# Patient Record
Sex: Female | Born: 1990 | Race: Black or African American | Hispanic: No | Marital: Married | Smoking: Never smoker
Health system: Southern US, Community
[De-identification: ages and names within clinical notes are randomized; demographics above are authoritative.]

## PROBLEM LIST (undated history)

## (undated) DIAGNOSIS — G43909 Migraine, unspecified, not intractable, without status migrainosus: Secondary | ICD-10-CM

## (undated) DIAGNOSIS — J45909 Unspecified asthma, uncomplicated: Secondary | ICD-10-CM

## (undated) DIAGNOSIS — I10 Essential (primary) hypertension: Secondary | ICD-10-CM

---

## 2015-12-03 ENCOUNTER — Emergency Department (HOSPITAL_BASED_OUTPATIENT_CLINIC_OR_DEPARTMENT_OTHER)
Admission: EM | Admit: 2015-12-03 | Discharge: 2015-12-03 | Disposition: A | Discharge status: Home / Self Care | Payer: Medicaid Other | Attending: Emergency Medicine | Admitting: Emergency Medicine

## 2015-12-03 ENCOUNTER — Emergency Department (HOSPITAL_BASED_OUTPATIENT_CLINIC_OR_DEPARTMENT_OTHER): Payer: Medicaid Other

## 2015-12-03 ENCOUNTER — Encounter (HOSPITAL_BASED_OUTPATIENT_CLINIC_OR_DEPARTMENT_OTHER): Payer: Self-pay | Admitting: Emergency Medicine

## 2015-12-03 DIAGNOSIS — O99611 Diseases of the digestive system complicating pregnancy, first trimester: Secondary | ICD-10-CM | POA: Diagnosis not present

## 2015-12-03 DIAGNOSIS — J45901 Unspecified asthma with (acute) exacerbation: Secondary | ICD-10-CM | POA: Insufficient documentation

## 2015-12-03 DIAGNOSIS — Z3A01 Less than 8 weeks gestation of pregnancy: Secondary | ICD-10-CM | POA: Insufficient documentation

## 2015-12-03 DIAGNOSIS — Z3490 Encounter for supervision of normal pregnancy, unspecified, unspecified trimester: Secondary | ICD-10-CM

## 2015-12-03 DIAGNOSIS — K0889 Other specified disorders of teeth and supporting structures: Secondary | ICD-10-CM | POA: Insufficient documentation

## 2015-12-03 DIAGNOSIS — O99511 Diseases of the respiratory system complicating pregnancy, first trimester: Secondary | ICD-10-CM | POA: Diagnosis not present

## 2015-12-03 HISTORY — DX: Unspecified asthma, uncomplicated: J45.909

## 2015-12-03 LAB — PREGNANCY, URINE: Preg Test, Ur: POSITIVE — AB

## 2015-12-03 MED ORDER — LORAZEPAM 1 MG PO TABS
0.5000 mg | ORAL_TABLET | Freq: Once | ORAL | Status: DC
  Filled 2015-12-03: qty 1

## 2015-12-03 MED ORDER — ACETAMINOPHEN 325 MG PO TABS
650.0000 mg | ORAL_TABLET | Freq: Once | ORAL | Status: AC
  Administered 2015-12-03: 650 mg via ORAL
  Filled 2015-12-03: qty 2

## 2015-12-03 MED ORDER — ACETAMINOPHEN 325 MG PO TABS
ORAL_TABLET | ORAL | Status: AC
  Filled 2015-12-03: qty 1

## 2015-12-03 MED ORDER — OXYCODONE-ACETAMINOPHEN 5-325 MG PO TABS
1.0000 | ORAL_TABLET | Freq: Once | ORAL | Status: DC
  Filled 2015-12-03: qty 1

## 2015-12-03 MED ORDER — ALBUTEROL SULFATE HFA 108 (90 BASE) MCG/ACT IN AERS
2.0000 | INHALATION_SPRAY | Freq: Once | RESPIRATORY_TRACT | Status: AC
  Administered 2015-12-03: 2 via RESPIRATORY_TRACT
  Filled 2015-12-03: qty 6.7

## 2015-12-03 MED ORDER — IBUPROFEN 400 MG PO TABS
400.0000 mg | ORAL_TABLET | Freq: Once | ORAL | Status: DC
  Filled 2015-12-03: qty 1

## 2015-12-03 NOTE — ED Notes (Signed)
Patient states she has mouth pain from gums swelling on the lower right back side.  Patient states she has become short of breath since yesterday and while she was sleeping.  She states she has asthma, but the SOB seems different.  She denies fever, vomiting or fatigue.

## 2015-12-03 NOTE — ED Notes (Signed)
Patient requesting a pregnancy test before receiving medications.

## 2015-12-03 NOTE — ED Notes (Signed)
Patient stable and ambulatory. Patient verbalizes understanding of discharge instructions and follow-up. 

## 2015-12-03 NOTE — ED Notes (Signed)
Tylenol 325mg  was dropped on the floor

## 2015-12-03 NOTE — ED Provider Notes (Signed)
CSN: [id number]     Arrival date & time 12/03/15  0857 History   First MD Initiated Contact with Patient 12/03/15 [phone] [id number]     Chief Complaint  Patient presents with  . Dental Pain  . Shortness of Breath     (Consider location/radiation/quality/duration/timing/severity/associated sxs/prior Treatment) HPI   25 year old female with dental pain. Right lower molar. Gradual onset about 2-3 days ago. Constant and progressive since then. Denies any trauma. No fevers or chills. No neck pain or difficulty swallowing. No fevers or chills. She is additionally complaining of some dyspnea since yesterday. Has sensation that she cannot take a deep breath. She is a past history of asthma and says current symptoms feel different. Does not notice any change with exertion. No unusual leg pain or swelling. Has not tried taking anything for her symptoms. No recent surgeries, prolonged immobilization or exogenous hormone usage.  Past Medical History  Diagnosis Date  . Asthma    Past Surgical History  Procedure Laterality Date  . Cesarean section     No family history on file. Social History  Substance Use Topics  . Smoking status: Never Smoker   . Smokeless tobacco: Never Used  . Alcohol Use: No   OB History    Gravida Para Term Preterm AB TAB SAB Ectopic Multiple Living   2 0 0 0 1 0 0 0 0 1      Review of Systems  All systems reviewed and negative, other than as noted in HPI.   Allergies  Review of patient's allergies indicates not on file.  Home Medications   Prior to Admission medications   Not on File   BP 138/73 mmHg  Pulse 86  Temp(Src) 98.4 F (36.9 C) (Oral)  Resp 18  Ht 5\' 7"  (1.702 m)  Wt 179 lb 6.4 oz (81.375 kg)  BMI 28.09 kg/m2  SpO2 100% Physical Exam  Constitutional: She appears well-developed and well-nourished. No distress.  HENT:  Head: Normocephalic and atraumatic.  Right lower third molar erupting. May be somewhat impacted. Some mild surrounding gingival  swelling. No obvious abscess. Submental tissues are soft. Neck is supple. Handling secretions. Normal sounding voice. No adenopathy.  Eyes: Conjunctivae are normal. Right eye exhibits no discharge. Left eye exhibits no discharge.  Neck: Neck supple.  Cardiovascular: Normal rate, regular rhythm and normal heart sounds.  Exam reveals no gallop and no friction rub.   No murmur heard. Pulmonary/Chest: Effort normal and breath sounds normal. No respiratory distress.  Abdominal: Soft. She exhibits no distension. There is no tenderness.  Musculoskeletal: She exhibits no edema or tenderness.  Lower extremities symmetric as compared to each other. No calf tenderness. Negative Homan's. No palpable cords.   Neurological: She is alert.  Skin: Skin is warm and dry.  Psychiatric: She has a normal mood and affect. Her behavior is normal. Thought content normal.  Nursing note and vitals reviewed.   ED Course  Procedures (including critical care time) Labs Review Labs Reviewed  PREGNANCY, URINE - Abnormal; Notable for the following:    Preg Test, Ur POSITIVE (*)    All other components within normal limits    Imaging Review No results found. I have personally reviewed and evaluated these images and lab results as part of my medical decision-making.   EKG Interpretation   Date/Time:  Friday December 03 2015 09:33:11 EST Ventricular Rate:  81 PR Interval:  184 QRS Duration: 99 QT Interval:  358 QTC Calculation: 415 R Axis:   -11 Text Interpretation:  Sinus rhythm RSR' in V1 or V2, right VCD or RVH No  old tracing to compare Confirmed by Juleen China  MD, Meleny Tregoning [phone]) on 12/03/2015  9:40:27 AM      MDM   Final diagnoses:  Pain, dental  Pregnancy    25 year old female with dental pain. Some mild gingival swelling around her right lower third molar. Her wisdom tooth may be somewhat impacted. No drainable collection. HEENT exam is otherwise unremarkable. She is also complaining some chest  tightness/dyspnea. She is in no acute distress on exam though. She is afebrile and hemodynamically stable. No increased work of breathing. Oxygen saturations are normal on room air. She does report a past history of asthma, although I cannot appreciate any wheezing on my exam. Will try some albuterol to see if she gets symptomatic relief from this. Basic screening studies with a chest x-ray and EKG. Overall I have a low suspicion for emergent process though. Doubt PE, dissection, ACS or other emergent pathology.    Raeford Razor, MD 12/05/15 1230

## 2015-12-03 NOTE — ED Notes (Signed)
MD notified the pregnancy test was positive.  Orders cancelled per MD's request.  No medication given to patient.

## 2015-12-03 NOTE — ED Notes (Signed)
Patient states she has been to the doctor and she is 51/[redacted] weeks pregnant and she looked on her phone and her last cycle was 10/27/2015.  OB/GYN dates were corrected.

## 2015-12-03 NOTE — Discharge Instructions (Signed)
First Trimester of Pregnancy The first trimester of pregnancy is from week 1 until the end of week 12 (months 1 through 3). During this time, your baby will begin to develop inside you. At 6-8 weeks, the eyes and face are formed, and the heartbeat can be seen on ultrasound. At the end of 12 weeks, all the baby's organs are formed. Prenatal care is all the medical care you receive before the birth of your baby. Make sure you get good prenatal care and follow all of your doctor's instructions. HOME CARE  Medicines  Take medicine only as told by your doctor. Some medicines are safe and some are not during pregnancy.  Take your prenatal vitamins as told by your doctor.  Take medicine that helps you poop (stool softener) as needed if your doctor says it is okay. Diet  Eat regular, healthy meals.  Your doctor will tell you the amount of weight gain that is right for you.  Avoid raw meat and uncooked cheese.  If you feel sick to your stomach (nauseous) or throw up (vomit):  Eat 4 or 5 small meals a day instead of 3 large meals.  Try eating a few soda crackers.  Drink liquids between meals instead of during meals.  If you have a hard time pooping (constipation):  Eat high-fiber foods like fresh vegetables, fruit, and whole grains.  Drink enough fluids to keep your pee (urine) clear or pale yellow. Activity and Exercise  Exercise only as told by your doctor. Stop exercising if you have cramps or pain in your lower belly (abdomen) or low back.  Try to avoid standing for long periods of time. Move your legs often if you must stand in one place for a long time.  Avoid heavy lifting.  Wear low-heeled shoes. Sit and stand up straight.  You can have sex unless your doctor tells you not to. Relief of Pain or Discomfort  Wear a good support bra if your breasts are sore.  Take warm water baths (sitz baths) to soothe pain or discomfort caused by hemorrhoids. Use hemorrhoid cream if your  doctor says it is okay.  Rest with your legs raised if you have leg cramps or low back pain.  Wear support hose if you have puffy, bulging veins (varicose veins) in your legs. Raise (elevate) your feet for 15 minutes, 3-4 times a day. Limit salt in your diet. Prenatal Care  Schedule your prenatal visits by the twelfth week of pregnancy.  Write down your questions. Take them to your prenatal visits.  Keep all your prenatal visits as told by your doctor. Safety  Wear your seat belt at all times when driving.  Make a list of emergency phone numbers. The list should include numbers for family, friends, the hospital, and police and fire departments. General Tips  Ask your doctor for a referral to a local prenatal class. Begin classes no later than at the start of month 6 of your pregnancy.  Ask for help if you need counseling or help with nutrition. Your doctor can give you advice or tell you where to go for help.  Do not use hot tubs, steam rooms, or saunas.  Do not douche or use tampons or scented sanitary pads.  Do not cross your legs for long periods of time.  Avoid litter boxes and soil used by cats.  Avoid all smoking, herbs, and alcohol. Avoid drugs not approved by your doctor.  Do not use any tobacco products, including cigarettes,  chewing tobacco, and electronic cigarettes. If you need help quitting, ask your doctor. You may get counseling or other support to help you quit.  Visit your dentist. At home, brush your teeth with a soft toothbrush. Be gentle when you floss. GET HELP IF:  You are dizzy.  You have mild cramps or pressure in your lower belly.  You have a nagging pain in your belly area.  You continue to feel sick to your stomach, throw up, or have watery poop (diarrhea).  You have a bad smelling fluid coming from your vagina.  You have pain with peeing (urination).  You have increased puffiness (swelling) in your face, hands, legs, or ankles. GET HELP  RIGHT AWAY IF:   You have a fever.  You are leaking fluid from your vagina.  You have spotting or bleeding from your vagina.  You have very bad belly cramping or pain.  You gain or lose weight rapidly.  You throw up blood. It may look like coffee grounds.  You are around people who have [place] measles, fifth disease, or chickenpox.  You have a very bad headache.  You have shortness of breath.  You have any kind of trauma, such as from a fall or a car accident.   This information is not intended to replace advice given to you by your health care provider. Make sure you discuss any questions you have with your health care provider.   Document Released: 04/24/2008 Document Revised: 11/27/2014 Document Reviewed: 09/16/2013 Elsevier Interactive Patient Education 2016 [employer]   Emergency Department Resource Guide 1) Find a Doctor and Pay Out of Pocket Although you won't have to find out who is covered by your insurance plan, it is a good idea to ask around and get recommendations. You will then need to call the office and see if the doctor you have chosen will accept you as a new patient and what types of options they offer for patients who are self-pay. Some doctors offer discounts or will set up payment plans for their patients who do not have insurance, but you will need to ask so you aren't surprised when you get to your appointment.  2) Contact Your Local Health Department Not all health departments have doctors that can see patients for sick visits, but many do, so it is worth a call to see if yours does. If you don't know where your local health department is, you can check in your phone book. The CDC also has a tool to help you locate your state's health department, and many state websites also have listings of all of their local health departments.  3) Find a Walk-in Clinic If your illness is not likely to be very severe or complicated, you may want to try a walk in  clinic. These are popping up all over the country in pharmacies, drugstores, and shopping centers. They're usually staffed by nurse practitioners or physician assistants that have been trained to treat common illnesses and complaints. They're usually fairly quick and inexpensive. However, if you have serious medical issues or chronic medical problems, these are probably not your best option.  No Primary Care Doctor: - Call Health Connect at  [phone] - they can help you locate a primary care doctor that  accepts your insurance, provides certain services, etc. - Physician Referral Service- [phone]  Chronic Pain Problems: Organization         Address  Phone   Notes  [facility]  [phone] Patients need to be referred by their primary care doctor.   Medication Assistance: Organization         Address  Phone   Notes  [facility] Medication [facility] [address] [place], [place] [address] [phone] --Must be a resident of [facility] -- Must have NO insurance coverage whatsoever (no Medicaid/ Medicare, etc.) -- The pt. MUST have a primary care doctor that directs their care regularly and follows them in the community   [facility]ist  [phone]   [employer]  [phone]    Agencies that provide inexpensive medical care: Organization         Address  Phone   Notes  [facility]  [phone]   [facility]    [phone]   [facility] [address] [place], [place] [address] [phone]   [facility] [address], [place] [phone]   Planned Parenthood    [phone]   [facility]    [phone]   [facility] and [facility]  [address], [place] Phone:  [phone], Fax:  [phone] Hours of Operation:  9 am - 6 pm, M-F.  Also accepts Medicaid/Medicare and self-pay.  [facility]  [address], [place] Phone: [phone], Fax: [phone]. Hours of Operation:  8:30 am - 5:30 pm, M-F.  Also accepts Medicaid and self-pay.  [facility] [place] [address], [place] Phone: [phone]   [facility] [address] Natasha Bence [place], [place] [phone], Ext. [phone] Mondays & Thursdays: 7-9 AM.  First 15 patients are seen on a first come, first serve basis.    Medicaid-accepting [facility] Providers:  Organization         Address  Phone   Notes  [facility] [address], [place] [phone] Also accepts self-pay patients.  [facility] [address] Laurell Josephs [place], [place]  [phone]   [facility] [address], [place] [phone]   [facility] [address] [place] [phone]   Renaye Rakers [address], [place]   [phone] Only accepts [place] Access [place] patients after they have their name applied to their card.   Self-Pay (no insurance) in [facility]:  Organization         Address  Phone   Notes  Sickle Cell Patients, [facility] [address] [place], [place] [phone]   [facility] [address] [place], [place] [phone]   [facility] [place]  [address], [place] [phone]   [facility]/Dr. Osei-Bonsu  [address], [place] or [address], [place] [phone] Phone number for both [place] and [place] locations is the same.  Urgent Medical and [facility] [address] [place] [phone]   [facility] [address] [place] or [address] [phone]   [facility] [address] [place] [phone], phone; [phone], fax Sees patients  1st and 3rd Saturday of every month.  Must not qualify for public or private insurance (i.e. Medicaid, Medicare, [place] Health Choice, Veterans' Benefits)  Household income should be no more than 200% of the poverty level The clinic cannot treat you if you are pregnant or think you are pregnant  Sexually transmitted diseases are not treated at the clinic.    Dental Care: Organization         Address  Phone  Notes  [facility] [address] [place], [place] [phone] Accepts children up to age 70 who are enrolled in [place] or [place] Health Choice; pregnant women with a Medicaid card; and children who have applied for Medicaid or [place] Health Choice, but were declined, whose parents can pay a reduced fee at time of service.  [facility] of [facility]  [address], [place] [phone] Accepts children up to age 52 who are enrolled in [place] or [place] Health Choice; pregnant women with a Medicaid card; and children who have applied for Medicaid or  Health Choice, but were declined, whose parents can pay a reduced fee at time of service.  [facility]  [address] [place], [place] [phone] Patients are seen by appointment only. Walk-ins are not accepted. [facility] will see patients 56 years of age and older. Monday - Tuesday (8am-5pm) Most Wednesdays (8:30-5pm) $30 per visit, cash only  [facility]  [address], [facility] [phone] Patients are seen by appointment only. Walk-ins are not accepted. [facility] will see patients 1 years of age and older. One Wednesday Evening (Monthly: Volunteer Based).  $30 per visit, cash only  [employer]  [phone] for adults; Children under age 83, call Graduate Pediatric Dentistry at [phone]. Children aged 32-14, please call [phone] to request a  pediatric application.  Dental services are provided in all areas of dental care including fillings, crowns and bridges, complete and partial dentures, implants, gum treatment, root canals, and extractions. Preventive care is also provided. Treatment is provided to both adults and children. Patients are selected via a lottery and there is often a waiting list.   [facility] [address], [place]  [phone] [email or web address]   [facility] [address] [place], [place] [phone], Ext. [phone] Second and Fourth Thursday of each month, opens at 6:30 AM; Clinic ends at 9 AM.  Patients are seen on a first-come first-served basis, and a limited number are seen during each clinic.   [facility]  [address] Ether Griffins [place], [place] [phone]   Eligibility Requirements You must have lived in [place], [place], or [place] counties for at least the last three months.   You cannot be eligible for state or federal sponsored [place], including [employer], [place], or [employer].   You generally cannot be eligible for healthcare insurance through your employer.    How to apply: Eligibility screenings are held every Tuesday and Wednesday afternoon from 1:00 pm until 4:00 pm. You do not need an appointment for the interview!  [facility] [address] [place], [place] [phone]   [facility] Health Department  [phone]   [facility] Health Department  [phone]   [facility] Health Department  [phone]    Behavioral Health Resources in the Community: Intensive Outpatient Programs Organization         Address  Phone  Notes  [facility] [address] [place], [place]  [phone]   [facility] [address] [place], [place] [phone]   [facility] [address] [place], [place]  [phone]   [facility]  Mental Health [address],  [place], [place] [phone] or [phone]   Substance Abuse Resources Organization         Address  Phone  Notes  [facility] Drug Services  [phone]   [facility]  [phone]   [facility] [place]  [phone]   Floydene Flock  [phone]   Residential & Outpatient Substance Abuse Program  [phone]   Psychological Services Organization         Address  Phone  Notes  [facility]  [phone]   [facility]  [phone]   [facility] [address], [place] [phone] or [phone]    Mobile Crisis Teams Organization         Address  Phone  Notes  [facility], Mobile Crisis Care Unit  [phone]   [facility]  [address] [place], [place] [phone]   Doristine Locks [address] [place] [phone]    Self-Help/Support Groups Organization         Address  Phone             Notes  [facility] of [place] - variety of support groups  336- I7437963 Call for more information  Narcotics Anonymous (NA), [facility] [address], [employer] [place]  2 meetings at this location   Statistician         Address  Phone  Notes  [facility] [address],    [place]  [phone]   [facility]  [address] [place] [address], [place], [place] [phone]   [facility] [address] [place], [place] [phone] Admissions: 8am-3pm M-F  [facility] [address]    [place], [place] [phone]   [facility] [address] [place], [place], [place] [phone]   [facility] [address]  [place], [place] [phone]   [facility] - Intensive Outpatient [address] Laurell Josephs [address], [place], [place] [phone]   [facility] (Addiction Recovery [facility] [address]    [place], [place] [phone] or [phone]   [facility] ([facility]) [address], [place], [place] [phone] Accepts Medicaid  [facility] [address]  [place] [phone] Substance Abuse/Addiction Treatment   [facility] Organization         Address  Phone  Notes  [facility]  6696349497   Angie Fava, PhD [address] Ervin Knack [place], [place]   [phone] or [phone]   [facility] Behavioral   [address] [place], [place] [phone]   [facility] [address] [place], [place] [phone] Insurance/Medicaid/sponsorship through [facility] and Families [address]                                    [place], [place] [phone] Therapy/tele-psych/case  [facility] [address], [place] [phone]    Dr. Lolly Mustache  [phone]   [facility]  [employer] Dept. 1) [address], [place] 2) [address], [place] 3)  [address], [place] [phone]  [phone]   [facility] Child Abuse Hotline [phone] or [phone] (After Hours)

## 2017-01-05 ENCOUNTER — Encounter (HOSPITAL_BASED_OUTPATIENT_CLINIC_OR_DEPARTMENT_OTHER): Payer: Self-pay

## 2017-01-05 ENCOUNTER — Emergency Department (HOSPITAL_BASED_OUTPATIENT_CLINIC_OR_DEPARTMENT_OTHER)
Admission: EM | Admit: 2017-01-05 | Discharge: 2017-01-05 | Disposition: A | Discharge status: Home / Self Care | Payer: Medicaid Other | Attending: Emergency Medicine | Admitting: Emergency Medicine

## 2017-01-05 DIAGNOSIS — J45909 Unspecified asthma, uncomplicated: Secondary | ICD-10-CM | POA: Insufficient documentation

## 2017-01-05 DIAGNOSIS — I10 Essential (primary) hypertension: Secondary | ICD-10-CM | POA: Diagnosis not present

## 2017-01-05 DIAGNOSIS — G43909 Migraine, unspecified, not intractable, without status migrainosus: Secondary | ICD-10-CM

## 2017-01-05 DIAGNOSIS — R110 Nausea: Secondary | ICD-10-CM

## 2017-01-05 HISTORY — DX: Essential (primary) hypertension: I10

## 2017-01-05 HISTORY — DX: Migraine, unspecified, not intractable, without status migrainosus: G43.909

## 2017-01-05 MED ORDER — METOCLOPRAMIDE HCL 5 MG/ML IJ SOLN
10.0000 mg | Freq: Once | INTRAMUSCULAR | Status: AC
  Administered 2017-01-05: 10 mg via INTRAVENOUS
  Filled 2017-01-05: qty 2

## 2017-01-05 MED ORDER — KETOROLAC TROMETHAMINE 30 MG/ML IJ SOLN
30.0000 mg | Freq: Once | INTRAMUSCULAR | Status: AC
  Administered 2017-01-05: 30 mg via INTRAVENOUS
  Filled 2017-01-05: qty 1

## 2017-01-05 MED ORDER — METOCLOPRAMIDE HCL 10 MG PO TABS: 10.0000 mg | ORAL_TABLET | Freq: Four times a day (QID) | ORAL | 0 refills | Status: DC | PRN

## 2017-01-05 MED ORDER — SODIUM CHLORIDE 0.9 % IV BOLUS (SEPSIS)
1000.0000 mL | Freq: Once | INTRAVENOUS | Status: AC
  Administered 2017-01-05: 1000 mL via INTRAVENOUS

## 2017-01-05 MED ORDER — DEXAMETHASONE SODIUM PHOSPHATE 10 MG/ML IJ SOLN
10.0000 mg | Freq: Once | INTRAMUSCULAR | Status: AC
  Administered 2017-01-05: 10 mg via INTRAVENOUS
  Filled 2017-01-05: qty 1

## 2017-01-05 MED ORDER — DIPHENHYDRAMINE HCL 50 MG/ML IJ SOLN
25.0000 mg | Freq: Once | INTRAMUSCULAR | Status: AC
  Administered 2017-01-05: 25 mg via INTRAVENOUS
  Filled 2017-01-05: qty 1

## 2017-01-05 NOTE — ED Triage Notes (Signed)
C/o migraine x 3 days-NAD-steady gait 

## 2017-01-05 NOTE — ED Provider Notes (Signed)
[facility] Provider Note   CSN: [id number] Arrival date & time: 01/05/17  1511  By signing my name below, I, Alyssa GroveMartin Green, attest that this documentation has been prepared under the direction and in the presence of [address]Jairy Angulo, [place]. Electronically Signed: Alyssa GroveMartin Green, ED Scribe. 01/05/17. 4:49 PM.  History   Chief Complaint Chief Complaint  Patient presents with  . Migraine   The history is provided by the patient and medical records. No language interpreter was used.  Migraine  This is a recurrent problem. The current episode started more than 2 days ago. The problem occurs constantly. The problem has not changed since onset.Associated symptoms include headaches. Pertinent negatives include no chest pain, no abdominal pain and no shortness of breath. The symptoms are aggravated by standing. Nothing relieves the symptoms. She has tried rest (and fiorcet) for the symptoms. The treatment provided mild relief.   HPI Comments: Rachel Greene is a 26 y.o. female with a PMHx of migraines, who presents to the Emergency Department complaining of a migraine for 3 days. Pt describes migraine as a gradual onset, 10/10 constant, throbbing, non-radiating, generalized headache that feels like a band around her head, exacerbated by standing for extended periods and light exposure. Pt was seen at [facility] for these same symptoms on 12/19/16 and was prescribed Zofran and Fioricet with minimal relief. She states this migraine is similar to prior migraines. She reports associated intermittent nausea. Denies lightheadedness, vision changes, fevers, chills, CP, SOB, abd pain, V/D/C, hematuria, dysuria, myalgias, arthralgias, numbness, tingling, focal weakness, or any other complaints at this time.    Past Medical History:  Diagnosis Date  . Asthma   . Hypertension   . Migraine     There are no active problems to display for this patient.   Past Surgical History:  Procedure  Laterality Date  . CESAREAN SECTION      OB History    Gravida Para Term Preterm AB Living   2 0 0 0 1 1   SAB TAB Ectopic Multiple Live Births   0 0 0 0         Home Medications    Prior to Admission medications   Medication Sig Start Date End Date Taking? Authorizing Provider  UNKNOWN TO PATIENT BP,migraine, nausea meds   Yes Historical Provider, MD    Family History No family history on file.  Social History Social History  Substance Use Topics  . Smoking status: Never Smoker  . Smokeless tobacco: Never Used  . Alcohol use No     Allergies   Penicillins   Review of Systems Review of Systems  Constitutional: Negative for chills and fever.  Eyes: Positive for photophobia. Negative for visual disturbance.  Respiratory: Negative for shortness of breath.   Cardiovascular: Negative for chest pain.  Gastrointestinal: Positive for nausea (intermittent). Negative for abdominal pain, constipation, diarrhea and vomiting.  Genitourinary: Negative for dysuria and hematuria.  Musculoskeletal: Negative for arthralgias and myalgias.  Skin: Negative for color change.  Allergic/Immunologic: Negative for immunocompromised state.  Neurological: Positive for headaches. Negative for weakness, light-headedness and numbness.  Psychiatric/Behavioral: Negative for confusion.   10 Systems reviewed and are negative for acute change except as noted in the HPI.    Physical Exam Updated Vital Signs BP 134/96 (BP Location: Left Arm)   Pulse 93   Temp 98.4 F (36.9 C) (Oral)   Resp 18   Ht 5\' 7"  (1.702 m)   Wt 170 lb (77.1  kg)   SpO2 99%   BMI 26.63 kg/m   Physical Exam  Constitutional: She is oriented to person, place, and time. Vital signs are normal. She appears well-developed and well-nourished.  Non-toxic appearance. No distress.  Afebrile, nontoxic, NAD  HENT:  Head: Normocephalic and atraumatic.  Mouth/Throat: Oropharynx is clear and moist and mucous membranes are  normal.  Eyes: Conjunctivae and EOM are normal. Pupils are equal, round, and reactive to light. Right eye exhibits no discharge. Left eye exhibits no discharge.  PERRL, EOMI, no nystagmus, no visual field deficits   Neck: Normal range of motion. Neck supple. No spinous process tenderness and no muscular tenderness present. No neck rigidity. Normal range of motion present.  FROM intact without spinous process TTP, no bony stepoffs or deformities, no paraspinous muscle TTP or muscle spasms. No rigidity or meningeal signs. No bruising or swelling.   Cardiovascular: Normal rate, regular rhythm, normal heart sounds and intact distal pulses.  Exam reveals no gallop and no friction rub.   No murmur heard. Pulmonary/Chest: Effort normal and breath sounds normal. No respiratory distress. She has no decreased breath sounds. She has no wheezes. She has no rhonchi. She has no rales.  Abdominal: Soft. Normal appearance and bowel sounds are normal. She exhibits no distension. There is no tenderness. There is no rigidity, no rebound, no guarding, no CVA tenderness, no tenderness at McBurney's point and negative Murphy's sign.  Musculoskeletal: Normal range of motion.  MAE x4 Strength and sensation grossly intact in all extremities Distal pulses intact Gait steady  Neurological: She is alert and oriented to person, place, and time. She has normal strength. No cranial nerve deficit or sensory deficit. Coordination and gait normal. GCS eye subscore is 4. GCS verbal subscore is 5. GCS motor subscore is 6.  CN 2-12 grossly intact A&O x4 GCS 15 Sensation and strength intact Gait nonataxic including with tandem walking Coordination with finger-to-nose WNL Neg pronator drift   Skin: Skin is warm, dry and intact. No rash noted.  Psychiatric: She has a normal mood and affect.  Nursing note and vitals reviewed.   ED Treatments / Results  DIAGNOSTIC STUDIES: Oxygen Saturation is 99% on RA, normal by my  interpretation.    COORDINATION OF CARE: 4:28 PM Discussed treatment plan with pt at bedside which includes migraine cocktail and pt agreed to plan.  Labs (all labs ordered are listed, but only abnormal results are displayed) Labs Reviewed - No data to display  EKG  EKG Interpretation None       Radiology No results found.  Procedures Procedures (including critical care time)  Medications Ordered in ED Medications  metoCLOPramide (REGLAN) injection 10 mg (10 mg Intravenous Given 01/05/17 1725)    And  diphenhydrAMINE (BENADRYL) injection 25 mg (25 mg Intravenous Given 01/05/17 1727)    And  sodium chloride 0.9 % bolus 1,000 mL (1,000 mLs Intravenous New Bag/Given 01/05/17 1725)    And  ketorolac (TORADOL) 30 MG/ML injection 30 mg (30 mg Intravenous Given 01/05/17 1726)  dexamethasone (DECADRON) injection 10 mg (10 mg Intravenous Given 01/05/17 1729)     Initial Impression / Assessment and Plan / ED Course  I have reviewed the triage vital signs and the nursing notes.  Pertinent labs & imaging results that were available during my care of the patient were reviewed by me and considered in my medical decision making (see chart for details).     26 y.o. female here with migraine which she states is  his typical migraine. No focal neuro deficits on exam. Will give migraine cocktail+decadron and reassess shortly. Doubt need for further emergent work up, doubt acute secondary etiology for her migraine today. Will reassess shortly.   5:57 PM Pt feeling much better. Will d/c home with reglan, advised tylenol/motrin for headaches, f/up with PCP in 1wk for recheck of symptoms and ongoing management of her headaches. I explained the diagnosis and have given explicit precautions to return to the ER including for any other new or worsening symptoms. The patient understands and accepts the medical plan as it's been dictated and I have answered their questions. Discharge instructions  concerning home care and prescriptions have been given. The patient is STABLE and is discharged to home in good condition.   I personally performed the services described in this documentation, which was scribed in my presence. The recorded information has been reviewed and is accurate.   Final Clinical Impressions(s) / ED Diagnoses   Final diagnoses:  Migraine without status migrainosus, not intractable, unspecified migraine type  Nausea    New Prescriptions New Prescriptions   METOCLOPRAMIDE (REGLAN) 10 MG TABLET    Take 1 tablet (10 mg total) by mouth every 6 (six) hours as needed for nausea (nausea/headache).     [address] PA-C 01/05/17 1759    Geoffery Lyons, MD 01/05/17 2024

## 2017-01-05 NOTE — Discharge Instructions (Signed)
Alternate between Ibuprofen and Tylenol as needed for pain. Use reglan as needed for headaches or nausea. Stay well hydrated. Get plenty of rest. Follow up with your regular doctor in 1 week for recheck of symptoms and ongoing management of your headaches. Return to the ER for changes or worsening symptoms.

## 2017-03-08 IMAGING — CR DG CHEST 2V
2 series · 2 of 2 positions shown · non-contrast
Comparison: 12/09/2014

CLINICAL DATA: Shortness of breath, cough for 2 days

EXAM:
CHEST  2 VIEW

[w chest pa]
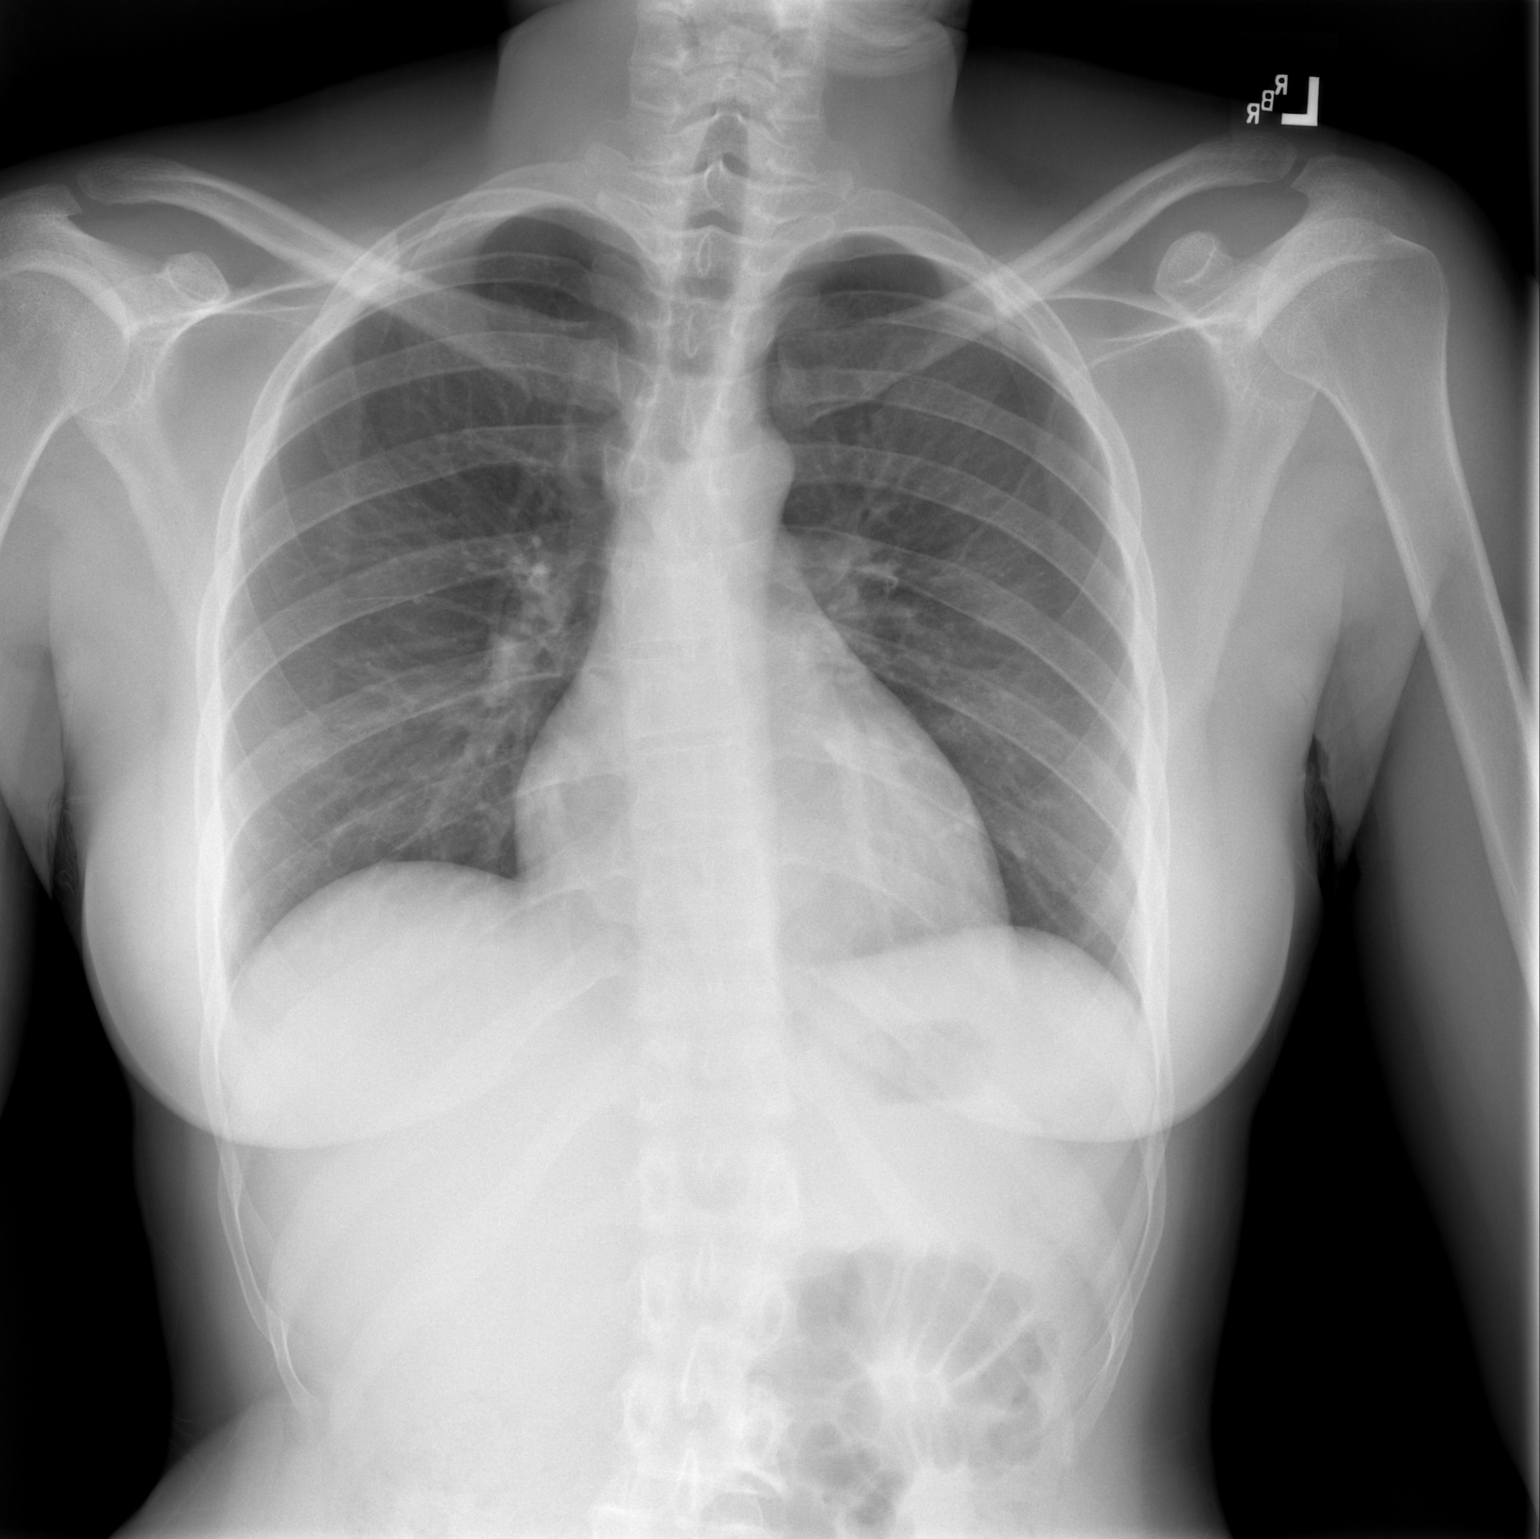

[w chest lat]
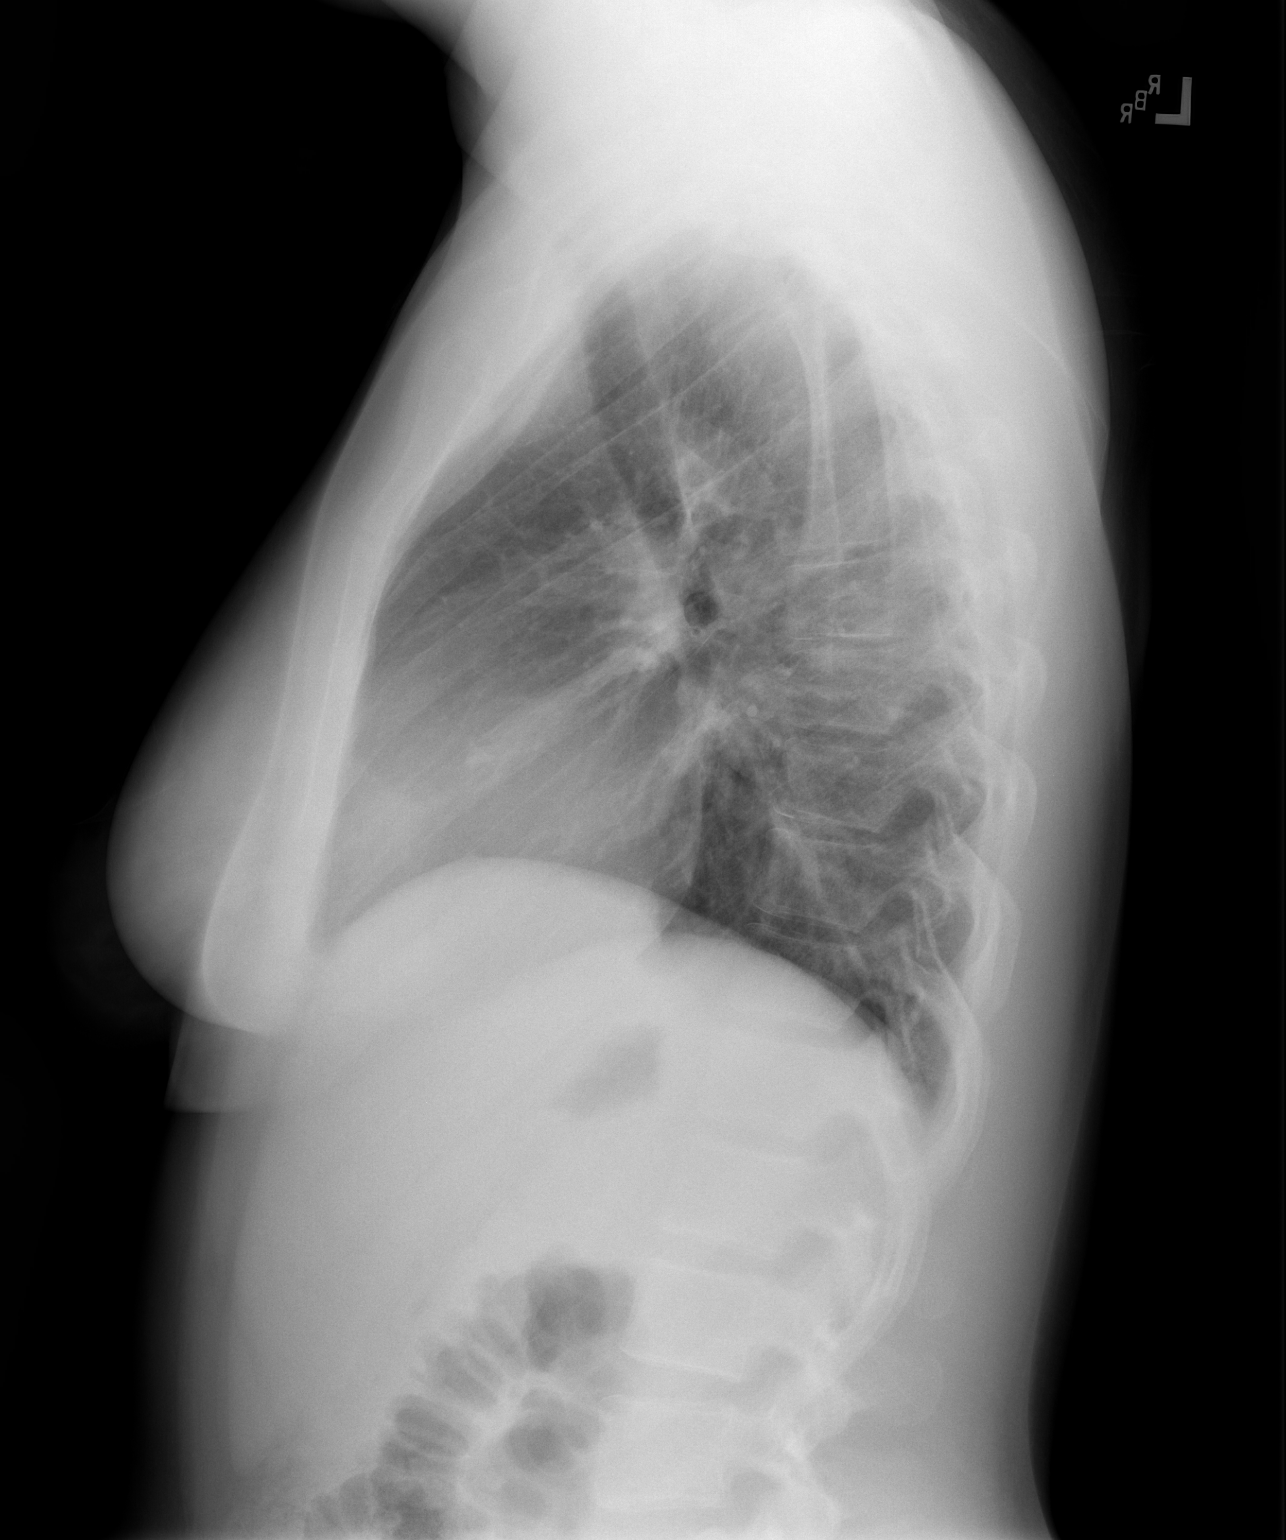

[2 of 2 positions shown; findings below may reference images not displayed]

FINDINGS: Cardiomediastinal silhouette is stable. Mild upper thoracic
dextroscoliosis. No acute infiltrate or pleural effusion. No
pulmonary edema.
IMPRESSION: No active disease.  Mild upper thoracic dextroscoliosis.

## 2017-10-01 ENCOUNTER — Encounter (HOSPITAL_BASED_OUTPATIENT_CLINIC_OR_DEPARTMENT_OTHER): Payer: Self-pay | Admitting: Emergency Medicine

## 2017-10-01 ENCOUNTER — Other Ambulatory Visit: Payer: Self-pay

## 2017-10-01 ENCOUNTER — Emergency Department (HOSPITAL_BASED_OUTPATIENT_CLINIC_OR_DEPARTMENT_OTHER)
Admission: EM | Admit: 2017-10-01 | Discharge: 2017-10-01 | Disposition: A | Discharge status: Home / Self Care | Payer: Self-pay | Attending: Emergency Medicine | Admitting: Emergency Medicine

## 2017-10-01 DIAGNOSIS — I10 Essential (primary) hypertension: Secondary | ICD-10-CM | POA: Insufficient documentation

## 2017-10-01 DIAGNOSIS — R1110 Vomiting, unspecified: Secondary | ICD-10-CM | POA: Insufficient documentation

## 2017-10-01 DIAGNOSIS — R197 Diarrhea, unspecified: Secondary | ICD-10-CM | POA: Insufficient documentation

## 2017-10-01 DIAGNOSIS — J45909 Unspecified asthma, uncomplicated: Secondary | ICD-10-CM | POA: Insufficient documentation

## 2017-10-01 LAB — URINALYSIS, ROUTINE W REFLEX MICROSCOPIC
BILIRUBIN URINE: NEGATIVE
Glucose, UA: NEGATIVE mg/dL
Hgb urine dipstick: NEGATIVE
Ketones, ur: NEGATIVE mg/dL
LEUKOCYTES UA: NEGATIVE
NITRITE: NEGATIVE
PH: 6.5 (ref 5.0–8.0)
Protein, ur: NEGATIVE mg/dL
SPECIFIC GRAVITY, URINE: 1.025 (ref 1.005–1.030)

## 2017-10-01 LAB — PREGNANCY, URINE: PREG TEST UR: NEGATIVE

## 2017-10-01 MED ORDER — LOPERAMIDE HCL 2 MG PO CAPS: 4.0000 mg | ORAL_CAPSULE | Freq: Four times a day (QID) | ORAL | 0 refills | Status: DC | PRN

## 2017-10-01 MED ORDER — ONDANSETRON 4 MG PO TBDP
4.0000 mg | ORAL_TABLET | Freq: Once | ORAL | Status: AC
  Administered 2017-10-01: 4 mg via ORAL
  Filled 2017-10-01: qty 1

## 2017-10-01 MED ORDER — ONDANSETRON 4 MG PO TBDP: 4.0000 mg | ORAL_TABLET | Freq: Three times a day (TID) | ORAL | 0 refills | Status: DC | PRN

## 2017-10-01 NOTE — Discharge Instructions (Signed)
Were seen today for vomiting and diarrhea.  This is likely related to a viral illness.  Take Zofran as needed for vomiting.  Make sure that you are staying hydrated.  Hopefully you will be feeling better in the next 1-2 days.  If not you need to be followed up by her primary physician.

## 2017-10-01 NOTE — ED Triage Notes (Signed)
PT presents with c/o vomiting and chills for 3 days.

## 2017-10-01 NOTE — ED Provider Notes (Signed)
[facility] EMERGENCY DEPARTMENT Provider Note   CSN: [id number] Arrival date & time: 10/01/17  0027     History   Chief Complaint Chief Complaint  Patient presents with  . Emesis    HPI Rachel Greene is a 26 y.o. female.  HPI  This is a 26 year old female who presents with vomiting and diarrhea.  Patient reports onset of symptoms on Friday.  She reports nonbilious, nonbloody emesis and nonbloody diarrhea.  She reports that she is only been able to keep down water and ginger ale.  She reports chills without fevers.  No other upper respiratory symptoms including cough, shortness of breath, chest pain, abdominal pain.  She states that today was the first day that she was able to get out of bed.  However, she has continued to have some vomiting.  Past Medical History:  Diagnosis Date  . Asthma   . Hypertension   . Migraine     There are no active problems to display for this patient.   Past Surgical History:  Procedure Laterality Date  . CESAREAN SECTION      OB History    Gravida Para Term Preterm AB Living   2 0 0 0 1 1   SAB TAB Ectopic Multiple Live Births   0 0 0 0         Home Medications    Prior to Admission medications   Medication Sig Start Date End Date Taking? Authorizing Provider  loperamide (IMODIUM) 2 MG capsule Take 2 capsules (4 mg total) 4 (four) times daily as needed by mouth for diarrhea or loose stools. 10/01/17   Christia Coaxum, Mayer Maskerourtney F, MD  metoCLOPramide (REGLAN) 10 MG tablet Take 1 tablet (10 mg total) by mouth every 6 (six) hours as needed for nausea (nausea/headache). 01/05/17   Street, Mercedes, PA-C  ondansetron (ZOFRAN ODT) 4 MG disintegrating tablet Take 1 tablet (4 mg total) every 8 (eight) hours as needed by mouth for nausea or vomiting. 10/01/17   Auston Halfmann, Mayer Maskerourtney F, MD  UNKNOWN TO PATIENT BP,migraine, nausea meds    [provider]    Family History No family history on file.  Social History Social History    Tobacco Use  . Smoking status: Never Smoker  . Smokeless tobacco: Never Used  Substance Use Topics  . Alcohol use: No  . Drug use: No     Allergies   Penicillins   Review of Systems Review of Systems  Constitutional: Positive for chills. Negative for fever.  Respiratory: Negative for cough and shortness of breath.   Cardiovascular: Negative for chest pain.  Gastrointestinal: Positive for diarrhea, nausea and vomiting. Negative for abdominal pain.  Genitourinary: Negative for dysuria.  All other systems reviewed and are negative.    Physical Exam Updated Vital Signs BP 121/81 (BP Location: Left Arm)   Pulse 83   Temp 98.9 F (37.2 C) (Oral)   Resp 18   SpO2 100%   Physical Exam  Constitutional: She is oriented to person, place, and time. She appears well-developed and well-nourished.  HENT:  Head: Normocephalic and atraumatic.  Mucous membranes moist  Cardiovascular: Normal rate, regular rhythm and normal heart sounds.  Pulmonary/Chest: Effort normal. No respiratory distress. She has no wheezes.  Abdominal: Soft. Bowel sounds are normal. There is no tenderness.  Neurological: She is alert and oriented to person, place, and time.  Skin: Skin is warm and dry.  Psychiatric: She has a normal mood and affect.  Nursing note and  vitals reviewed.    ED Treatments / Results  Labs (all labs ordered are listed, but only abnormal results are displayed) Labs Reviewed  URINALYSIS, ROUTINE W REFLEX MICROSCOPIC  PREGNANCY, URINE    EKG  EKG Interpretation None       Radiology No results found.  Procedures Procedures (including critical care time)  Medications Ordered in ED Medications  ondansetron (ZOFRAN-ODT) disintegrating tablet 4 mg (4 mg Oral Given 10/01/17 0119)     Initial Impression / Assessment and Plan / ED Course  I have reviewed the triage vital signs and the nursing notes.  Pertinent labs & imaging results that were available during my  care of the patient were reviewed by me and considered in my medical decision making (see chart for details).     Patient presents with 3-day history of vomiting and diarrhea.  She is nontoxic appearing on exam.  Afebrile.  Exam is benign.  She appears well-hydrated.  No evidence of ketones in the urine.  Pregnancy test is negative.  No abdominal tenderness or pain.  Suspect viral etiology.  Recommend supportive measures including Zofran and Imodium.  Patient provided with a work note.  After history, exam, and medical workup I feel the patient has been appropriately medically screened and is safe for discharge home. Pertinent diagnoses were discussed with the patient. Patient was given return precautions.   Final Clinical Impressions(s) / ED Diagnoses   Final diagnoses:  Vomiting and diarrhea    ED Discharge Orders        Ordered    ondansetron (ZOFRAN ODT) 4 MG disintegrating tablet  Every 8 hours PRN     10/01/17 0121    loperamide (IMODIUM) 2 MG capsule  4 times daily PRN     10/01/17 0121       Shon BatonHorton, Chantal Worthey F, MD 10/01/17 [phone]

## 2018-01-16 ENCOUNTER — Encounter (HOSPITAL_BASED_OUTPATIENT_CLINIC_OR_DEPARTMENT_OTHER): Payer: Self-pay

## 2018-01-16 ENCOUNTER — Emergency Department (HOSPITAL_BASED_OUTPATIENT_CLINIC_OR_DEPARTMENT_OTHER)
Admission: EM | Admit: 2018-01-16 | Discharge: 2018-01-16 | Disposition: A | Discharge status: Home / Self Care | Payer: Self-pay | Attending: Emergency Medicine | Admitting: Emergency Medicine

## 2018-01-16 ENCOUNTER — Other Ambulatory Visit: Payer: Self-pay

## 2018-01-16 DIAGNOSIS — G43909 Migraine, unspecified, not intractable, without status migrainosus: Secondary | ICD-10-CM | POA: Insufficient documentation

## 2018-01-16 DIAGNOSIS — G43809 Other migraine, not intractable, without status migrainosus: Secondary | ICD-10-CM

## 2018-01-16 DIAGNOSIS — I10 Essential (primary) hypertension: Secondary | ICD-10-CM | POA: Insufficient documentation

## 2018-01-16 DIAGNOSIS — J45909 Unspecified asthma, uncomplicated: Secondary | ICD-10-CM | POA: Insufficient documentation

## 2018-01-16 MED ORDER — DIPHENHYDRAMINE HCL 50 MG/ML IJ SOLN
25.0000 mg | Freq: Once | INTRAMUSCULAR | Status: AC
  Administered 2018-01-16: 25 mg via INTRAVENOUS
  Filled 2018-01-16: qty 1

## 2018-01-16 MED ORDER — METOCLOPRAMIDE HCL 5 MG/ML IJ SOLN
10.0000 mg | Freq: Once | INTRAMUSCULAR | Status: AC
  Administered 2018-01-16: 10 mg via INTRAVENOUS
  Filled 2018-01-16: qty 2

## 2018-01-16 MED ORDER — KETOROLAC TROMETHAMINE 30 MG/ML IJ SOLN
30.0000 mg | Freq: Once | INTRAMUSCULAR | Status: AC
  Administered 2018-01-16: 30 mg via INTRAVENOUS
  Filled 2018-01-16: qty 1

## 2018-01-16 NOTE — ED Provider Notes (Signed)
[facility] EMERGENCY DEPARTMENT Provider Note   CSN: [id number] Arrival date & time: 01/16/18  1222     History   Chief Complaint Chief Complaint  Patient presents with  . Headache    HPI Rachel Greene is a 27 y.o. female.  HPI Rachel Greene is a 27 y.o. female with history of hypertension, asthma, migraine headaches presents to emergency department with complaint of headache.  Patient states she has had this headache for about 4 days.  She states nothing making it better or worse.  She states that she gets these headaches very frequently, almost daily, but normally able to go to sleep and sleep it off.  She has not tried taking any medications for prior to coming in.  She states she was diagnosed with migraines when she was 27 years old, and has not seen a neurologist since then.  She has seen her primary care doctor was seen at [facility] for the same several times and states "I did a CT scan and although I do just give me a migraine cocktail, but nobody can tell me why I am having this migraines.  Patient states right now her headache is mainly on the left side, wraps around the head.  Reports associated photophobia.  Reports nausea, no vomiting.  Reports loud sounds make her pain worse.  She denies being pregnant.  Denies any abdominal pain.  No chest pain.  No shortness of breath.  No numbness or weakness in extremities.  Patient is on Depo-Provera for birth control, no recent travel surgeries.  Past Medical History:  Diagnosis Date  . Asthma   . Hypertension   . Migraine     There are no active problems to display for this patient.   Past Surgical History:  Procedure Laterality Date  . CESAREAN SECTION      OB History    Gravida Para Term Preterm AB Living   2 0 0 0 1 1   SAB TAB Ectopic Multiple Live Births   0 0 0 0         Home Medications    Prior to Admission medications   Not on File    Family History No family history on  file.  Social History Social History   Tobacco Use  . Smoking status: Never Smoker  . Smokeless tobacco: Never Used  Substance Use Topics  . Alcohol use: No  . Drug use: No     Allergies   Penicillins   Review of Systems Review of Systems  Constitutional: Negative for chills and fever.  Eyes: Positive for photophobia.  Respiratory: Negative for cough, chest tightness and shortness of breath.   Cardiovascular: Negative for chest pain, palpitations and leg swelling.  Gastrointestinal: Negative for abdominal pain, diarrhea, nausea and vomiting.  Genitourinary: Negative for dysuria and flank pain.  Musculoskeletal: Negative for arthralgias, myalgias, neck pain and neck stiffness.  Skin: Negative for rash.  Neurological: Positive for headaches. Negative for dizziness and weakness.  All other systems reviewed and are negative.    Physical Exam Updated Vital Signs BP 122/78 (BP Location: Left Arm)   Pulse 89   Temp 99 F (37.2 C) (Oral)   Resp 18   Ht 5\' 7"  (1.702 m)   Wt 82.1 kg (181 lb)   SpO2 100%   BMI 28.35 kg/m   Physical Exam  Constitutional: She is oriented to person, place, and time. She appears well-developed and well-nourished. No distress.  HENT:  Head: Normocephalic and atraumatic.  Eyes: Conjunctivae are normal.  Neck: Normal range of motion. Neck supple.  Cardiovascular: Normal rate, regular rhythm and normal heart sounds.  Pulmonary/Chest: Effort normal and breath sounds normal. No respiratory distress. She has no wheezes. She has no rales.  Musculoskeletal: Normal range of motion. She exhibits no edema.  Neurological: She is alert and oriented to person, place, and time. No cranial nerve deficit. GCS eye subscore is 4. GCS verbal subscore is 5. GCS motor subscore is 6.  Normal speech. Moving all extremities. Pt refusing further neurological exam, states "headache is too bad."  Skin: Skin is warm and dry.  Psychiatric: She has a normal mood and  affect. Her behavior is normal.  Nursing note and vitals reviewed.    ED Treatments / Results  Labs (all labs ordered are listed, but only abnormal results are displayed) Labs Reviewed - No data to display  EKG  EKG Interpretation None       Radiology No results found.  Procedures Procedures (including critical care time)  Medications Ordered in ED Medications  ketorolac (TORADOL) 30 MG/ML injection 30 mg (30 mg Intravenous Given 01/16/18 1417)  metoCLOPramide (REGLAN) injection 10 mg (10 mg Intravenous Given 01/16/18 1417)  diphenhydrAMINE (BENADRYL) injection 25 mg (25 mg Intravenous Given 01/16/18 1417)     Initial Impression / Assessment and Plan / ED Course  I have reviewed the triage vital signs and the nursing notes.  Pertinent labs & imaging results that were available during my care of the patient were reviewed by me and considered in my medical decision making (see chart for details).     Patient in emergency department with typical for her migraine headache for 4 days.  No treatment prior to coming in.  She is refusing neurological exam because her headache is "too bad."  She was seen just a month ago at [facility] had a negative CT scan of her head at that time.  Will try a migraine cocktail and fluids  2:48 PM Pt received all of her medications. Feels better. Would like to be dc home. Will let her go home with referral to the headache wellness center. Return precautions discussed.   Vitals:   01/16/18 1234 01/16/18 1235 01/16/18 1437  BP: 125/85  122/78  Pulse: 78  89  Resp: 18  18  Temp: 99 F (37.2 C)    TempSrc: Oral    SpO2: 100%  100%  Weight:  82.1 kg (181 lb)   Height:  5\' 7"  (1.702 m)      Final Clinical Impressions(s) / ED Diagnoses   Final diagnoses:  Other migraine without status migrainosus, not intractable    ED Discharge Orders    None       Jaynie CrumbleKirichenko, Treesa Mccully, PA-C 01/16/18 1452    Doug SouJacubowitz, Sam, MD 01/16/18  1536

## 2018-01-16 NOTE — ED Triage Notes (Signed)
C/o HA x 4 days-NAD-steady gait

## 2018-01-16 NOTE — ED Notes (Signed)
NAD at this time. Pt is stable and going home.  

## 2018-01-16 NOTE — Discharge Instructions (Signed)
Continue to keep diary of your headaches. Take excedrin migraine for pain at home. Follow up with either neurology or headache wellness center.

## 2018-02-16 ENCOUNTER — Emergency Department (HOSPITAL_BASED_OUTPATIENT_CLINIC_OR_DEPARTMENT_OTHER)
Admission: EM | Admit: 2018-02-16 | Discharge: 2018-02-16 | Disposition: A | Discharge status: Home / Self Care | Payer: Medicaid Other | Attending: Emergency Medicine | Admitting: Emergency Medicine

## 2018-02-16 ENCOUNTER — Encounter (HOSPITAL_BASED_OUTPATIENT_CLINIC_OR_DEPARTMENT_OTHER): Payer: Self-pay | Admitting: *Deleted

## 2018-02-16 ENCOUNTER — Other Ambulatory Visit: Payer: Self-pay

## 2018-02-16 DIAGNOSIS — J45909 Unspecified asthma, uncomplicated: Secondary | ICD-10-CM | POA: Insufficient documentation

## 2018-02-16 DIAGNOSIS — I10 Essential (primary) hypertension: Secondary | ICD-10-CM | POA: Insufficient documentation

## 2018-02-16 DIAGNOSIS — B349 Viral infection, unspecified: Secondary | ICD-10-CM

## 2018-02-16 DIAGNOSIS — J029 Acute pharyngitis, unspecified: Secondary | ICD-10-CM | POA: Insufficient documentation

## 2018-02-16 LAB — RAPID STREP SCREEN (MED CTR MEBANE ONLY): STREPTOCOCCUS, GROUP A SCREEN (DIRECT): NEGATIVE

## 2018-02-16 MED ORDER — ACETAMINOPHEN 325 MG PO TABS
650.0000 mg | ORAL_TABLET | Freq: Once | ORAL | Status: AC | PRN
  Administered 2018-02-16: 650 mg via ORAL
  Filled 2018-02-16: qty 2

## 2018-02-16 NOTE — ED Provider Notes (Signed)
[facility] EMERGENCY DEPARTMENT Provider Note   CSN: [id number] Arrival date & time: 02/16/18  2100     History   Chief Complaint Chief Complaint  Patient presents with  . Chills  . Sore Throat    HPI Rachel Greene is a 27 y.o. female.  The history is provided by the patient.  Sore Throat  This is a new problem. The current episode started 12 to 24 hours ago. The problem occurs daily. The problem has been gradually worsening. Associated symptoms include headaches. The symptoms are aggravated by swallowing. Nothing relieves the symptoms. She has tried nothing for the symptoms.   Patient with history of asthma presents with headache and sore throat.  She reports her headache started yesterday, been having body aches as well as fevers.  Today she noted that she was having sore throat.  This is been worsening.  No vomiting/diarrhea.  No cough.  No rash.  No other acute complaints. Past Medical History:  Diagnosis Date  . Asthma   . Hypertension   . Migraine     There are no active problems to display for this patient.   Past Surgical History:  Procedure Laterality Date  . CESAREAN SECTION       OB History    Gravida  2   Para  0   Term  0   Preterm  0   AB  1   Living  1     SAB  0   TAB  0   Ectopic  0   Multiple  0   Live Births               Home Medications    Prior to Admission medications   Not on File    Family History No family history on file.  Social History Social History   Tobacco Use  . Smoking status: Never Smoker  . Smokeless tobacco: Never Used  Substance Use Topics  . Alcohol use: No  . Drug use: No     Allergies   Penicillins   Review of Systems Review of Systems  Constitutional: Positive for fatigue and fever.  HENT: Positive for sore throat.   Respiratory: Negative for cough.   Gastrointestinal: Negative for diarrhea.  Musculoskeletal: Positive for myalgias.  Skin: Negative for rash.    Neurological: Positive for headaches.  All other systems reviewed and are negative.    Physical Exam Updated Vital Signs BP 114/78 (BP Location: Right Arm)   Pulse 96   Temp 98.5 F (36.9 C) (Oral)   Resp 16   SpO2 97%   Physical Exam  CONSTITUTIONAL: Well developed/well nourished HEAD: Normocephalic/atraumatic EYES: EOMI/PERRL ENMT: Mucous membranes moist, uvula midline, erythema noted, no exudates. NECK: supple no meningeal signs SPINE/BACK:entire spine nontender CV: S1/S2 noted, no murmurs/rubs/gallops noted LUNGS: Lungs are clear to auscultation bilaterally, no apparent distress ABDOMEN: soft, nontender, no rebound or guarding, bowel sounds noted throughout abdomen GU:no cva tenderness NEURO: Pt is awake/alert/appropriate, moves all extremitiesx4.  No facial droop.   EXTREMITIES: pulses normal/equal, full ROM SKIN: warm, color normal PSYCH: no abnormalities of mood noted, alert and oriented to situation  ED Treatments / Results  Labs (all labs ordered are listed, but only abnormal results are displayed) Labs Reviewed  RAPID STREP SCREEN (NOT [facility])  CULTURE, GROUP A STREP [facility])    EKG None  Radiology No results found.  Procedures Procedures (including critical care time)  Medications Ordered in ED Medications  acetaminophen (TYLENOL) tablet 650 mg (650 mg Oral Given 02/16/18 2121)     Initial Impression / Assessment and Plan / ED Course  I have reviewed the triage vital signs and the nursing notes.  Pertinent labs results that were available during my care of the patient were reviewed by me and considered in my medical decision making (see chart for details).     Patient with likely viral pharyngitis.  She is well-appearing.  Vitals are improved.  Her neck is supple, no meningeal signs Lung sounds are clear, no sounds of pneumonia We will discharge home.  We discussed strict return precautions  Final Clinical Impressions(s) / ED Diagnoses    Final diagnoses:  Viral pharyngitis  Viral illness    ED Discharge Orders    None       Zadie Rhine, MD 02/16/18 2350

## 2018-02-16 NOTE — ED Triage Notes (Signed)
Cough, chills, sore throat, subjective fever since Friday

## 2018-02-16 NOTE — ED Notes (Signed)
ED Provider at bedside. 

## 2018-02-19 LAB — CULTURE, GROUP A STREP (THRC)

## 2021-10-27 ENCOUNTER — Other Ambulatory Visit: Payer: Self-pay

## 2021-10-27 ENCOUNTER — Encounter (HOSPITAL_COMMUNITY): Payer: Self-pay

## 2021-10-27 ENCOUNTER — Emergency Department (HOSPITAL_COMMUNITY)
Admission: EM | Admit: 2021-10-27 | Discharge: 2021-10-27 | Disposition: A | Discharge status: Home / Self Care | Payer: Medicaid Other | Attending: Emergency Medicine | Admitting: Emergency Medicine

## 2021-10-27 DIAGNOSIS — Z5321 Procedure and treatment not carried out due to patient leaving prior to being seen by health care provider: Secondary | ICD-10-CM | POA: Insufficient documentation

## 2021-10-27 DIAGNOSIS — Z8669 Personal history of other diseases of the nervous system and sense organs: Secondary | ICD-10-CM | POA: Insufficient documentation

## 2021-10-27 DIAGNOSIS — R519 Headache, unspecified: Secondary | ICD-10-CM | POA: Diagnosis present

## 2021-10-27 MED ORDER — ONDANSETRON 4 MG PO TBDP
8.0000 mg | ORAL_TABLET | Freq: Once | ORAL | Status: AC
  Administered 2021-10-27: 8 mg via ORAL
  Filled 2021-10-27: qty 2

## 2021-10-27 NOTE — ED Triage Notes (Signed)
Pt BIB [facility] for eval of migraine HA. Pt w/ hx of same. States this one is worse than normal

## 2021-10-27 NOTE — ED Provider Notes (Signed)
Emergency Medicine Provider Triage Evaluation Note  Rachel Greene , a 30 y.o. female  was evaluated in triage.  Pt complains of migraine, brought in by EMS. Worse for the past 2 hours which prompted her to call 911. Gradual onset 2 hours ago while sitting on her bed.  Initially located across her forehead, now located more to the right side of her head.  Similar to prior migraines.  Patient tried taking Tylenol however had vomiting and could not keep this down.  Review of Systems  Positive: Headache, vomiting, photophobia Negative: Fevers, chills, neck stiffness  Physical Exam  There were no vitals taken for this visit. Gen:   Awake, no distress   Resp:  Normal effort  MSK:   Moves extremities without difficulty  Other:  Cranial nerves 2-12 grossly intact  Medical Decision Making  Medically screening exam initiated at 1:50 AM.  Appropriate orders placed.  Zara Chess was informed that the remainder of the evaluation will be completed by another provider, this initial triage assessment does not replace that evaluation, and the importance of remaining in the ED until their evaluation is complete.     Jeannie Fend, PA-C 10/27/21 0202    Sabas Sous, MD 10/27/21 [phone]

## 2021-10-27 NOTE — ED Notes (Signed)
PT decided not to wait and follow up with primary DR.

## 2022-12-03 ENCOUNTER — Encounter (HOSPITAL_BASED_OUTPATIENT_CLINIC_OR_DEPARTMENT_OTHER): Payer: Self-pay | Admitting: Emergency Medicine

## 2022-12-03 ENCOUNTER — Other Ambulatory Visit: Payer: Self-pay

## 2022-12-03 ENCOUNTER — Emergency Department (HOSPITAL_BASED_OUTPATIENT_CLINIC_OR_DEPARTMENT_OTHER)
Admission: EM | Admit: 2022-12-03 | Discharge: 2022-12-03 | Disposition: A | Discharge status: Home / Self Care | Payer: Medicaid Other | Attending: Emergency Medicine | Admitting: Emergency Medicine

## 2022-12-03 DIAGNOSIS — Z20822 Contact with and (suspected) exposure to covid-19: Secondary | ICD-10-CM | POA: Insufficient documentation

## 2022-12-03 DIAGNOSIS — J101 Influenza due to other identified influenza virus with other respiratory manifestations: Secondary | ICD-10-CM | POA: Insufficient documentation

## 2022-12-03 DIAGNOSIS — J029 Acute pharyngitis, unspecified: Secondary | ICD-10-CM | POA: Diagnosis present

## 2022-12-03 DIAGNOSIS — J45909 Unspecified asthma, uncomplicated: Secondary | ICD-10-CM | POA: Diagnosis not present

## 2022-12-03 LAB — RESP PANEL BY RT-PCR (RSV, FLU A&B, COVID)  RVPGX2
Influenza A by PCR: POSITIVE — AB
Influenza B by PCR: NEGATIVE
Resp Syncytial Virus by PCR: NEGATIVE
SARS Coronavirus 2 by RT PCR: NEGATIVE

## 2022-12-03 LAB — GROUP A STREP BY PCR: Group A Strep by PCR: NOT DETECTED

## 2022-12-03 MED ORDER — IBUPROFEN 800 MG PO TABS
800.0000 mg | ORAL_TABLET | Freq: Once | ORAL | Status: AC
  Administered 2022-12-03: 800 mg via ORAL
  Filled 2022-12-03: qty 1

## 2022-12-03 MED ORDER — XOFLUZA (80 MG DOSE) 1 X 80 MG PO TBPK: 80.0000 mg | ORAL_TABLET | Freq: Once | ORAL | 0 refills | Status: AC

## 2022-12-03 MED ORDER — ONDANSETRON 4 MG PO TBDP: 4.0000 mg | ORAL_TABLET | Freq: Three times a day (TID) | ORAL | 0 refills | Status: DC | PRN

## 2022-12-03 NOTE — ED Notes (Signed)
Discharge paperwork given and verbally understood. 

## 2022-12-03 NOTE — ED Triage Notes (Signed)
Sore throat started yesterday as well

## 2022-12-03 NOTE — ED Provider Notes (Signed)
[place] EMERGENCY DEPT Provider Note   CSN: [id number] Arrival date & time: 12/03/22  0626     History  Chief Complaint  Patient presents with   Nausea    Rachel Greene is a 32 y.o. female.  32 year old female brought in by EMS with concern for sore throat, cough, vomiting onset overnight.  Patient states that she knew that she was not sleeping well.  Patient's wife apparently told EMS that she was having a hard time breathing so she called 911.  Patient was provided with Zofran and route, no longer vomiting.  Reports 3 episodes of nonbloody nonbilious emesis today.  History of migraines, asthma, gestational hypertension.  No other complaints or concerns. Exposed to a friend's child last week who has tested positive for flu.       Home Medications Prior to Admission medications   Medication Sig Start Date End Date Taking? Authorizing Provider  Baloxavir Marboxil,80 MG Dose, (XOFLUZA, 80 MG DOSE,) 1 x 80 MG TBPK Take 80 mg by mouth once for 1 dose. 12/03/22 12/03/22 Yes Tacy Learn, PA-C  ondansetron (ZOFRAN-ODT) 4 MG disintegrating tablet Take 1 tablet (4 mg total) by mouth every 8 (eight) hours as needed for nausea or vomiting. 12/03/22  Yes Tacy Learn, PA-C      Allergies    Penicillins    Review of Systems   Review of Systems Negative except as per HPI Physical Exam Updated Vital Signs BP 124/77 (BP Location: Right Arm)   Pulse 88   Temp 98.5 F (36.9 C) (Oral)   Resp 18   SpO2 98%  Physical Exam Vitals and nursing note reviewed.  Constitutional:      General: She is not in acute distress.    Appearance: She is well-developed. She is not diaphoretic.  HENT:     Head: Normocephalic and atraumatic.     Right Ear: Tympanic membrane and ear canal normal.     Left Ear: Tympanic membrane and ear canal normal.     Nose: Nose normal.     Mouth/Throat:     Mouth: Mucous membranes are moist.     Pharynx: Posterior oropharyngeal erythema  present. No oropharyngeal exudate.  Eyes:     Conjunctiva/sclera: Conjunctivae normal.  Cardiovascular:     Rate and Rhythm: Normal rate and regular rhythm.     Heart sounds: Normal heart sounds.  Pulmonary:     Effort: Pulmonary effort is normal.     Breath sounds: Normal breath sounds.  Musculoskeletal:     Cervical back: Neck supple.  Lymphadenopathy:     Cervical: No cervical adenopathy.  Skin:    General: Skin is warm and dry.     Findings: No erythema or rash.  Neurological:     Mental Status: She is alert and oriented to person, place, and time.  Psychiatric:        Behavior: Behavior normal.     ED Results / Procedures / Treatments   Labs (all labs ordered are listed, but only abnormal results are displayed) Labs Reviewed  RESP PANEL BY RT-PCR (RSV, FLU A&B, COVID)  RVPGX2 - Abnormal; Notable for the following components:      Result Value   Influenza A by PCR POSITIVE (*)    All other components within normal limits  GROUP A STREP BY PCR    EKG None  Radiology No results found.  Procedures Procedures    Medications Ordered in ED Medications  ibuprofen (ADVIL) tablet 800  mg (800 mg Oral Given 12/03/22 1109)    ED Course/ Medical Decision Making/ A&P                             Medical Decision Making Risk Prescription drug management.   32 year old female brought in by EMS with sore throat and vomiting as above.  On exam, well-appearing, nontoxic in no distress.  Blood pressure mildly elevated, tachycardic on arrival, improved on recheck.  O2 sat 99% on room air.  Found to have mild erythema of the oropharynx without exudate or tender cervical adenopathy.  Patient has tested negative for strep, positive for influenza A, negative for COVID/RSV.  She is provided with ibuprofen for her headache.  Recommend home to rest, hydrate, Motrin and Tylenol.  Prescribed Xofluza, discussed helpful if she can start this medication today, provided with a printed copy  to take to pharmacy with in stock supply.        Final Clinical Impression(s) / ED Diagnoses Final diagnoses:  Influenza A    Rx / DC Orders ED Discharge Orders          Ordered    Baloxavir Marboxil,80 MG Dose, (XOFLUZA, 80 MG DOSE,) 1 x 80 MG TBPK   Once        12/03/22 1053    ondansetron (ZOFRAN-ODT) 4 MG disintegrating tablet  Every 8 hours PRN        12/03/22 1053              Roque Lias 12/03/22 1113    Fransico Meadow, MD 12/06/22 [phone]

## 2022-12-03 NOTE — ED Triage Notes (Signed)
Pts wife states she heard her breathing hard in sleep, started n/vthis am after midnight.

## 2022-12-03 NOTE — Discharge Instructions (Signed)
Home to rest.  Motrin and Tylenol as needed as directed.  Zofran as needed as prescribed for nausea and vomiting. Rachel Greene is used to treat the flu, you will need to call the pharmacies to see where you can find this in stock.  This medication will only help if you can take it today, possibly tomorrow.  There is a coupon card on the [facility] website to help with the cost of this medication.

## 2022-12-03 NOTE — ED Triage Notes (Signed)
Patient arrives via EMS from with c/o of cough, vomiting, and chest wall pain x1 day. Positive flu exposure.  4mg  Zofran IV given en route. 20g L AC.   145/97 99 HR 99 % Room air 112 cbg

## 2022-12-04 ENCOUNTER — Other Ambulatory Visit: Payer: Self-pay

## 2022-12-04 ENCOUNTER — Encounter (HOSPITAL_COMMUNITY): Payer: Self-pay | Admitting: Internal Medicine

## 2022-12-04 ENCOUNTER — Observation Stay (HOSPITAL_COMMUNITY)
Admission: EM | Admit: 2022-12-04 | Discharge: 2022-12-05 | Disposition: A | Discharge status: Home / Self Care | Payer: Medicaid Other | Attending: Internal Medicine | Admitting: Internal Medicine

## 2022-12-04 ENCOUNTER — Emergency Department (HOSPITAL_COMMUNITY): Payer: Medicaid Other

## 2022-12-04 DIAGNOSIS — I10 Essential (primary) hypertension: Secondary | ICD-10-CM | POA: Diagnosis not present

## 2022-12-04 DIAGNOSIS — R112 Nausea with vomiting, unspecified: Secondary | ICD-10-CM | POA: Diagnosis not present

## 2022-12-04 DIAGNOSIS — J09X1 Influenza due to identified novel influenza A virus with pneumonia: Secondary | ICD-10-CM | POA: Insufficient documentation

## 2022-12-04 DIAGNOSIS — R0602 Shortness of breath: Secondary | ICD-10-CM | POA: Diagnosis present

## 2022-12-04 DIAGNOSIS — E876 Hypokalemia: Secondary | ICD-10-CM | POA: Insufficient documentation

## 2022-12-04 DIAGNOSIS — Z79899 Other long term (current) drug therapy: Secondary | ICD-10-CM | POA: Insufficient documentation

## 2022-12-04 DIAGNOSIS — J45909 Unspecified asthma, uncomplicated: Secondary | ICD-10-CM | POA: Diagnosis not present

## 2022-12-04 DIAGNOSIS — J1100 Influenza due to unidentified influenza virus with unspecified type of pneumonia: Secondary | ICD-10-CM | POA: Diagnosis not present

## 2022-12-04 DIAGNOSIS — J111 Influenza due to unidentified influenza virus with other respiratory manifestations: Secondary | ICD-10-CM

## 2022-12-04 LAB — CBC WITH DIFFERENTIAL/PLATELET
Abs Immature Granulocytes: 0.01 10*3/uL (ref 0.00–0.07)
Basophils Absolute: 0 10*3/uL (ref 0.0–0.1)
Basophils Relative: 0 %
Eosinophils Absolute: 0.1 10*3/uL (ref 0.0–0.5)
Eosinophils Relative: 2 %
HCT: 36.9 % (ref 36.0–46.0)
Hemoglobin: 12 g/dL (ref 12.0–15.0)
Immature Granulocytes: 0 %
Lymphocytes Relative: 15 %
Lymphs Abs: 0.7 10*3/uL (ref 0.7–4.0)
MCH: 29.6 pg (ref 26.0–34.0)
MCHC: 32.5 g/dL (ref 30.0–36.0)
MCV: 91.1 fL (ref 80.0–100.0)
Monocytes Absolute: 0.3 10*3/uL (ref 0.1–1.0)
Monocytes Relative: 8 %
Neutro Abs: 3.5 10*3/uL (ref 1.7–7.7)
Neutrophils Relative %: 75 %
Platelets: 155 10*3/uL (ref 150–400)
RBC: 4.05 MIL/uL (ref 3.87–5.11)
RDW: 13.2 % (ref 11.5–15.5)
WBC: 4.6 10*3/uL (ref 4.0–10.5)
nRBC: 0 % (ref 0.0–0.2)

## 2022-12-04 LAB — COMPREHENSIVE METABOLIC PANEL
ALT: 44 U/L (ref 0–44)
AST: 36 U/L (ref 15–41)
Albumin: 3.4 g/dL — ABNORMAL LOW (ref 3.5–5.0)
Alkaline Phosphatase: 58 U/L (ref 38–126)
Anion gap: 10 (ref 5–15)
BUN: 13 mg/dL (ref 6–20)
CO2: 18 mmol/L — ABNORMAL LOW (ref 22–32)
Calcium: 8.4 mg/dL — ABNORMAL LOW (ref 8.9–10.3)
Chloride: 108 mmol/L (ref 98–111)
Creatinine, Ser: 0.94 mg/dL (ref 0.44–1.00)
GFR, Estimated: >60 mL/min (ref >60)
Glucose, Bld: 122 mg/dL — ABNORMAL HIGH (ref 70–99)
Potassium: 3.1 mmol/L — ABNORMAL LOW (ref 3.5–5.1)
Sodium: 136 mmol/L (ref 135–145)
Total Bilirubin: 0.5 mg/dL (ref 0.3–1.2)
Total Protein: 6.6 g/dL (ref 6.5–8.1)

## 2022-12-04 LAB — I-STAT BETA HCG BLOOD, ED (MC, WL, AP ONLY): I-stat hCG, quantitative: <5 m[IU]/mL (ref <5)

## 2022-12-04 LAB — LACTIC ACID, PLASMA: Lactic Acid, Venous: 1.5 mmol/L (ref 0.5–1.9)

## 2022-12-04 LAB — PROCALCITONIN: Procalcitonin: <0.1 ng/mL

## 2022-12-04 MED ORDER — ACETAMINOPHEN 500 MG PO TABS
1000.0000 mg | ORAL_TABLET | Freq: Once | ORAL | Status: AC
  Administered 2022-12-04: 1000 mg via ORAL
  Filled 2022-12-04: qty 2

## 2022-12-04 MED ORDER — ALBUTEROL SULFATE (2.5 MG/3ML) 0.083% IN NEBU: 2.5000 mg | INHALATION_SOLUTION | RESPIRATORY_TRACT | Status: DC | PRN

## 2022-12-04 MED ORDER — ACETAMINOPHEN 500 MG PO TABS
500.0000 mg | ORAL_TABLET | Freq: Four times a day (QID) | ORAL | Status: DC
  Administered 2022-12-04 – 2022-12-05 (×3): 500 mg via ORAL
  Filled 2022-12-04 (×2): qty 1

## 2022-12-04 MED ORDER — ACETAMINOPHEN 325 MG PO TABS: 650.0000 mg | ORAL_TABLET | Freq: Four times a day (QID) | ORAL | Status: DC | PRN

## 2022-12-04 MED ORDER — ONDANSETRON HCL 4 MG/2ML IJ SOLN
4.0000 mg | Freq: Once | INTRAMUSCULAR | Status: AC
  Administered 2022-12-04: 4 mg via INTRAVENOUS
  Filled 2022-12-04: qty 2

## 2022-12-04 MED ORDER — LACTATED RINGERS IV SOLN: INTRAVENOUS | Status: DC

## 2022-12-04 MED ORDER — PROCHLORPERAZINE EDISYLATE 10 MG/2ML IJ SOLN
10.0000 mg | Freq: Four times a day (QID) | INTRAMUSCULAR | Status: DC | PRN
  Administered 2022-12-04: 10 mg via INTRAVENOUS
  Filled 2022-12-04: qty 2

## 2022-12-04 MED ORDER — ACETAMINOPHEN 650 MG RE SUPP: 650.0000 mg | Freq: Four times a day (QID) | RECTAL | Status: DC

## 2022-12-04 MED ORDER — DIPHENHYDRAMINE HCL 50 MG/ML IJ SOLN
25.0000 mg | Freq: Once | INTRAMUSCULAR | Status: AC
  Administered 2022-12-04: 25 mg via INTRAVENOUS
  Filled 2022-12-04: qty 1

## 2022-12-04 MED ORDER — ACETAMINOPHEN 650 MG RE SUPP: 650.0000 mg | Freq: Four times a day (QID) | RECTAL | Status: DC | PRN

## 2022-12-04 MED ORDER — DEXTROMETHORPHAN POLISTIREX ER 30 MG/5ML PO SUER
30.0000 mg | Freq: Two times a day (BID) | ORAL | Status: DC
  Administered 2022-12-04 – 2022-12-05 (×2): 30 mg via ORAL
  Filled 2022-12-04 (×3): qty 5

## 2022-12-04 MED ORDER — KETOROLAC TROMETHAMINE 15 MG/ML IJ SOLN
15.0000 mg | Freq: Once | INTRAMUSCULAR | Status: AC
  Administered 2022-12-04: 15 mg via INTRAVENOUS
  Filled 2022-12-04: qty 1

## 2022-12-04 MED ORDER — SODIUM CHLORIDE 0.9 % IV BOLUS
1000.0000 mL | Freq: Once | INTRAVENOUS | Status: AC
  Administered 2022-12-04: 1000 mL via INTRAVENOUS

## 2022-12-04 MED ORDER — DOXYLAMINE SUCCINATE (SLEEP) 25 MG PO TABS: 50.0000 mg | ORAL_TABLET | Freq: Every evening | ORAL | Status: DC | PRN

## 2022-12-04 MED ORDER — OSELTAMIVIR PHOSPHATE 75 MG PO CAPS
75.0000 mg | ORAL_CAPSULE | Freq: Two times a day (BID) | ORAL | Status: DC
  Administered 2022-12-04 – 2022-12-05 (×3): 75 mg via ORAL
  Filled 2022-12-04 (×3): qty 1

## 2022-12-04 MED ORDER — POTASSIUM CHLORIDE CRYS ER 20 MEQ PO TBCR
40.0000 meq | EXTENDED_RELEASE_TABLET | Freq: Once | ORAL | Status: AC
  Administered 2022-12-04: 40 meq via ORAL
  Filled 2022-12-04: qty 2

## 2022-12-04 NOTE — ED Provider Triage Note (Signed)
Emergency Medicine Provider Triage Evaluation Note  Rachel Greene , a 32 y.o. female  was evaluated in triage.  Pt complains of cough, runny nose, nasal congestion for 2 days.  Denies any fever.  She tested positive for flu yesterday at [place] ED. Reports her shortness of breath has gotten worse.  She also reports chest pain that goes to the back. Endorse nonbloody nonbillious vomting.  Review of Systems  Positive: As above Negative: As above  Physical Exam  BP (!) 148/97   Pulse (!) 119   Temp 100 F (37.8 C) (Oral)   Resp 20   SpO2 96%  Gen:   Awake, no distress   Resp:  Normal effort  MSK:   Moves extremities without difficulty  Other:    Medical Decision Making  Medically screening exam initiated at 10:08 AM.  Appropriate orders placed.  Juanell Fairly was informed that the remainder of the evaluation will be completed by another provider, this initial triage assessment does not replace that evaluation, and the importance of remaining in the ED until their evaluation is complete.     Rex Kras, [place] 12/04/22 2102

## 2022-12-04 NOTE — ED Triage Notes (Signed)
Patient BIB [facility] from home evaluation of worsening shortness of breath and malaise after being testing positive for influenza yesterday at [facility] ED. Patient is alert, oriented, and in no apparent distress at this time.

## 2022-12-04 NOTE — Plan of Care (Signed)

## 2022-12-04 NOTE — ED Notes (Signed)
ED TO INPATIENT HANDOFF REPORT  ED Nurse Name and Phone #:   Morrie Sheldon EMT-P [phone]  S Name/Age/Gender Rachel Greene 32 y.o. female Room/Bed: H015C/H015C  Code Status   Code Status: Not on file  Home/SNF/Other Home Patient oriented to: self, place, time, and situation Is this baseline? Yes   Triage Complete: Triage complete  Chief Complaint Intractable nausea and vomiting [R11.2]  Triage Note Patient BIB [facility] from home evaluation of worsening shortness of breath and malaise after being testing positive for influenza yesterday at [facility] ED. Patient is alert, oriented, and in no apparent distress at this time.   Allergies Allergies  Allergen Reactions   Penicillins     Level of Care/Admitting Diagnosis ED Disposition     ED Disposition  Admit   Condition  --   Comment  Hospital Area: [facility] [[id number]]  Level of Care: Med-Surg [16]  May place patient in observation at [facility] or [place] if equivalent level of care is available:: No  Covid Evaluation: Confirmed COVID Negative  Diagnosis: Intractable nausea and vomiting [720114]  Admitting Physician: Manfred Shirts  Attending Physician: Waldron Labs, DAWOOD S [[id number]]          B Medical/Surgery History Past Medical History:  Diagnosis Date   Asthma    Hypertension    Migraine    Past Surgical History:  Procedure Laterality Date   CESAREAN SECTION       A IV Location/Drains/Wounds Patient Lines/Drains/Airways Status     Active Line/Drains/Airways     Name Placement date Placement time Site Days   Peripheral IV 12/04/22 20 G 1.16" Anterior;Left Hand 12/04/22  1240  Hand  less than 1            Intake/Output Last 24 hours No intake or output data in the 24 hours ending 12/04/22 1505  Labs/Imaging Results for orders placed or performed during the hospital encounter of 12/04/22 (from the past 48 hour(s))  Lactic acid, plasma     Status: None    Collection Time: 12/04/22 10:01 AM  Result Value Ref Range   Lactic Acid, Venous 1.5 0.5 - 1.9 mmol/L    Comment: Performed at [facility] [address], [place], [place] [address]  Comprehensive metabolic panel     Status: Abnormal   Collection Time: 12/04/22 10:11 AM  Result Value Ref Range   Sodium 136 135 - 145 mmol/L   Potassium 3.1 (L) 3.5 - 5.1 mmol/L   Chloride 108 98 - 111 mmol/L   CO2 18 (L) 22 - 32 mmol/L   Glucose, Bld 122 (H) 70 - 99 mg/dL    Comment: Glucose reference range applies only to samples taken after fasting for at least 8 hours.   BUN 13 6 - 20 mg/dL   Creatinine, Ser 0.94 0.44 - 1.00 mg/dL   Calcium 8.4 (L) 8.9 - 10.3 mg/dL   Total Protein 6.6 6.5 - 8.1 g/dL   Albumin 3.4 (L) 3.5 - 5.0 g/dL   AST 36 15 - 41 U/L   ALT 44 0 - 44 U/L   Alkaline Phosphatase 58 38 - 126 U/L   Total Bilirubin 0.5 0.3 - 1.2 mg/dL   GFR, Estimated >60 >60 mL/min    Comment: (NOTE) Calculated using the CKD-EPI Creatinine Equation (2021)    Anion gap 10 5 - 15    Comment: Performed at [facility], [place] [address] [place], [place] [address]  CBC  with Differential     Status: None   Collection Time: 12/04/22 10:11 AM  Result Value Ref Range   WBC 4.6 4.0 - 10.5 K/uL   RBC 4.05 3.87 - 5.11 MIL/uL   Hemoglobin 12.0 12.0 - 15.0 g/dL   HCT 36.9 36.0 - 46.0 %   MCV 91.1 80.0 - 100.0 fL   MCH 29.6 26.0 - 34.0 pg   MCHC 32.5 30.0 - 36.0 g/dL   RDW 13.2 11.5 - 15.5 %   Platelets 155 150 - 400 K/uL   nRBC 0.0 0.0 - 0.2 %   Neutrophils Relative % 75 %   Neutro Abs 3.5 1.7 - 7.7 K/uL   Lymphocytes Relative 15 %   Lymphs Abs 0.7 0.7 - 4.0 K/uL   Monocytes Relative 8 %   Monocytes Absolute 0.3 0.1 - 1.0 K/uL   Eosinophils Relative 2 %   Eosinophils Absolute 0.1 0.0 - 0.5 K/uL   Basophils Relative 0 %   Basophils Absolute 0.0 0.0 - 0.1 K/uL   Immature Granulocytes 0 %   Abs Immature Granulocytes 0.01 0.00 - 0.07 K/uL    Comment: Performed at [facility],  [address], [place], [place] [address]  I-Stat beta hCG blood, ED     Status: None   Collection Time: 12/04/22 10:29 AM  Result Value Ref Range   I-stat hCG, quantitative <5.0 <5 mIU/mL   Comment 3            Comment:   GEST. AGE      CONC.  (mIU/mL)   <=1 WEEK        5 - 50     2 WEEKS       50 - 500     3 WEEKS       100 - 10,000     4 WEEKS     1,000 - 30,000        FEMALE AND NON-PREGNANT FEMALE:     LESS THAN 5 mIU/mL    DG Chest 2 View  Result Date: 12/04/2022 CLINICAL DATA:  Recent diagnosis of influenza infection EXAM: CHEST - 2 VIEW COMPARISON:  Chest radiograph dated 12/03/2015 FINDINGS: Low lung volumes. Patchy right middle lobe opacity. No pleural effusion or pneumothorax. Enlarged cardiomediastinal silhouette is likely projectional related to low lung volumes. The visualized skeletal structures are unremarkable. IMPRESSION: Patchy right middle lobe opacity, likely corresponding to reported influenza infection. Electronically Signed   By: Darrin Nipper M.D.   On: 12/04/2022 10:41    Pending Labs Unresulted Labs (From admission, onward)     Start     Ordered   12/04/22 1459  Procalcitonin - Baseline  Add-on,   AD        12/04/22 1458   12/04/22 1001  Lactic acid, plasma  Now then every 2 hours,   R (with STAT occurrences)      12/04/22 1001            Vitals/Pain Today's Vitals   12/04/22 0959 12/04/22 1007 12/04/22 1359 12/04/22 1443  BP:  (!) 148/97  134/87  Pulse:  (!) 119  (!) 101  Resp:  20  18  Temp:  100 F (37.8 C)  99.1 F (37.3 C)  TempSrc:  Oral  Oral  SpO2:  96%  98%  PainSc: 0-No pain  8      Isolation Precautions No active isolations  Medications Medications  oseltamivir (TAMIFLU) capsule 75 mg (has no administration in  time range)  sodium chloride 0.9 % bolus 1,000 mL (0 mLs Intravenous Stopped 12/04/22 1304)  acetaminophen (TYLENOL) tablet 1,000 mg (1,000 mg Oral Given 12/04/22 1240)  ondansetron (ZOFRAN) injection 4 mg (4 mg Intravenous  Given 12/04/22 1240)  ketorolac (TORADOL) 15 MG/ML injection 15 mg (15 mg Intravenous Given 12/04/22 1240)  diphenhydrAMINE (BENADRYL) injection 25 mg (25 mg Intravenous Given 12/04/22 1240)  potassium chloride SA (KLOR-CON M) CR tablet 40 mEq (40 mEq Oral Given 12/04/22 1358)    Mobility walks     R Recommendations: See Admitting Provider Note  Report given to:   Additional Notes:   Patient had a reaction to Toradol when given IV. MD made aware. She had severe itching in the arm and hand where pushed. She was given Benadryl with relief.

## 2022-12-04 NOTE — ED Provider Notes (Signed)
[facility] EMERGENCY DEPARTMENT Provider Note   CSN: [id number] Arrival date & time: 12/04/22  9622     History  Chief Complaint  Patient presents with   Influenza    Rachel Greene is a 32 y.o. female.  The history is provided by the patient and medical records. No language interpreter was used.  Influenza    32 yo Female BIBA positive for influenza A with worsening SOB and malaise. She was seen at [facility] ED yesterday where she was confirmed influenza A positive, and discharged with rx for zofluza and ondasteron. She was unable to start Zofluza because no pharmacy had any in stock. She has had cough, runny nose and nasal congestion x 2 days. Reports worsening SOB, malaise and overall feeling worse since yesterday. She is able to speak in full sentences and has SpO2 96% room air. Reports substernal chest pain, that is worsen with inspiration. She has had two episodes of nonbloody, nonbilious emesis today.                                                                                                                                                                                                                               Home Medications Prior to Admission medications   Medication Sig Start Date End Date Taking? Authorizing Provider  ondansetron (ZOFRAN-ODT) 4 MG disintegrating tablet Take 1 tablet (4 mg total) by mouth every 8 (eight) hours as needed for nausea or vomiting. 12/03/22   Tacy Learn, PA-C      Allergies    Penicillins    Review of Systems   Review of Systems  All other systems reviewed and are negative.   Physical Exam Updated Vital Signs BP (!) 148/97   Pulse (!) 119   Temp 100 F (37.8 C) (Oral)   Resp 20   SpO2 96%  Physical Exam Vitals and nursing note reviewed.  Constitutional:      General: She is not in acute distress.    Appearance: She is well-developed.  HENT:     Head: Atraumatic.  Eyes:     Conjunctiva/sclera:  Conjunctivae normal.  Cardiovascular:     Rate and Rhythm: Tachycardia present.     Pulses: Normal pulses.     Heart sounds: Normal heart sounds.  Pulmonary:     Effort: Pulmonary effort is normal.     Breath sounds: No wheezing, rhonchi or rales.  Abdominal:  Palpations: Abdomen is soft.     Tenderness: There is no abdominal tenderness.  Musculoskeletal:     Cervical back: Neck supple.  Skin:    Findings: No rash.  Neurological:     Mental Status: She is alert.  Psychiatric:        Mood and Affect: Mood normal.     ED Results / Procedures / Treatments   Labs (all labs ordered are listed, but only abnormal results are displayed) Labs Reviewed  COMPREHENSIVE METABOLIC PANEL - Abnormal; Notable for the following components:      Result Value   Potassium 3.1 (*)    CO2 18 (*)    Glucose, Bld 122 (*)    Calcium 8.4 (*)    Albumin 3.4 (*)    All other components within normal limits  LACTIC ACID, PLASMA  CBC WITH DIFFERENTIAL/PLATELET  LACTIC ACID, PLASMA  PROCALCITONIN  I-STAT BETA HCG BLOOD, ED ([facility], [facility], [facility] ONLY)    EKG None  Radiology DG Chest 2 View  Result Date: 12/04/2022 CLINICAL DATA:  Recent diagnosis of influenza infection EXAM: CHEST - 2 VIEW COMPARISON:  Chest radiograph dated 12/03/2015 FINDINGS: Low lung volumes. Patchy right middle lobe opacity. No pleural effusion or pneumothorax. Enlarged cardiomediastinal silhouette is likely projectional related to low lung volumes. The visualized skeletal structures are unremarkable. IMPRESSION: Patchy right middle lobe opacity, likely corresponding to reported influenza infection. Electronically Signed   By: Darrin Nipper M.D.   On: 12/04/2022 10:41    Procedures Procedures    Medications Ordered in ED Medications  oseltamivir (TAMIFLU) capsule 75 mg (has no administration in time range)  sodium chloride 0.9 % bolus 1,000 mL (0 mLs Intravenous Stopped 12/04/22 1304)  acetaminophen (TYLENOL) tablet 1,000 mg (1,000  mg Oral Given 12/04/22 1240)  ondansetron (ZOFRAN) injection 4 mg (4 mg Intravenous Given 12/04/22 1240)  ketorolac (TORADOL) 15 MG/ML injection 15 mg (15 mg Intravenous Given 12/04/22 1240)  diphenhydrAMINE (BENADRYL) injection 25 mg (25 mg Intravenous Given 12/04/22 1240)  potassium chloride SA (KLOR-CON M) CR tablet 40 mEq (40 mEq Oral Given 12/04/22 1358)    ED Course/ Medical Decision Making/ A&P                             Medical Decision Making Amount and/or Complexity of Data Reviewed Labs: ordered. Radiology: ordered.  Risk OTC drugs. Prescription drug management. Decision regarding hospitalization.   BP (!) 148/97   Pulse (!) 119   Temp 100 F (37.8 C) (Oral)   Resp 20   SpO2 96%   42:81 AM 32 year old female significant history of asthma, hypertension, migraine, brought here via EMS from home with cold symptoms.  Patient report for the past 3 days she has had fever, chills, body aches, congestion, cough, vomiting, body aches and overall not feeling well.  She was seen in the ED yesterday for her complaint and test positive for influenza A.  She was prescribed antiviral medication and antinausea medication but states she has went to multiple pharmacy but they do not have the antiviral medication available.  She still feels sick and felt her symptoms get progressively worse prompting this ER visit.  Does not endorse significant shortness of breath or abdominal pain no dysuria.  On exam, patient is laying in the left lateral decubitus position appears tired but nontoxic.  Heart with tachycardia lungs are overall clear to auscultation bilaterally without any wheezing.  Abdomen nontender.  Vital  signs remarkable for an oral temperature of 100 and a heart rate of 119 and O2 sats is 96% on room air.  Given her worsening symptoms, and associated vomiting, will give IV fluid, antiemetic, and nonopiate pain medication.  -Labs ordered, independently viewed and interpreted by me.  Labs  remarkable for K+ 3.1, potassium supplement given. -The patient was maintained on a cardiac monitor.  I personally viewed and interpreted the cardiac monitored which showed an underlying rhythm of: sinus tachycardia -Imaging independently viewed and interpreted by me and I agree with radiologist's interpretation.  Result remarkable for CXR showing patchy RML opacity -This patient presents to the ED for concern of cold sxs, this involves an extensive number of treatment options, and is a complaint that carries with it a high risk of complications and morbidity.  The differential diagnosis includes flu, covid, RSV, pna -Co morbidities that complicate the patient evaluation includes asthma -Treatment includes IVF, potassium, zofran, toradol -Reevaluation of the patient after these medicines showed that the patient improved -PCP office notes or outside notes reviewed -Discussion with specialist [facility] hospitalist DR. Elgergawy  -Escalation to admission/observation considered: patient felt comfortable with admission for sxs control for intractable nausea and vomiting in the setting of flu infection  Patient received Toradol however she developed itchiness shortly.  Suspect patient is allergic to Toradol.  Patient was given Benadryl with improvement of symptoms.         Final Clinical Impression(s) / ED Diagnoses Final diagnoses:  Influenza  Intractable nausea and vomiting    Rx / DC Orders ED Discharge Orders     None         Fayrene Helper, PA-C 12/04/22 1502    Alvira Monday, MD 12/05/22 1139

## 2022-12-04 NOTE — H&P (Signed)
[facility] H&P   Patient Demographics:    Rachel Greene, is a 32 y.o. female  MRN: [id number]   DOB - [birth year]  Admit Date - 12/04/2022  Outpatient Primary MD for the patient is Pcp, No  Referring MD/NP/PA: PA Bowie  Patient coming from: home  Chief Complaint  Patient presents with   Influenza      HPI:    Rachel Greene  is a 32 y.o. female, without significant past medical history, she presents to ED secondary to complaints of shortness of breath, nausea and vomiting, patient was diagnosed yesterday at [place] ED with influenza A positive, she was discharged home with treatment prescription of Xofluza, apparently unable to started because no pharmacy was stockinet, she does report cough, runny nose, nasal congestion x 2 days, she does report worsening dyspnea as well, she had no hypoxia while in ED, no fever, no white count, but she kept having persistent nausea and vomiting despite receiving IV Benadryl, and IV Zofran, so [facility] hospitalist consulted to admit for intractable nausea and vomiting.    Review of systems:      A full 10 point Review of Systems was done, except as stated above, all other Review of Systems were negative.   With Past History of the following :    Past Medical History:  Diagnosis Date   Asthma    Hypertension    Migraine       Past Surgical History:  Procedure Laterality Date   CESAREAN SECTION        Social History:     Social History   Tobacco Use   Smoking status: Never   Smokeless tobacco: Never  Substance Use Topics   Alcohol use: No       Family History :   Family history was reviewed, significant for diabetes and hypertension in her mother   Home Medications:   Prior to Admission medications   Medication Sig Start Date End Date Taking? Authorizing Provider  ondansetron (ZOFRAN-ODT) 4 MG disintegrating tablet Take 1  tablet (4 mg total) by mouth every 8 (eight) hours as needed for nausea or vomiting. 12/03/22   Tacy Learn, PA-C     Allergies:     Allergies  Allergen Reactions   Penicillins      Physical Exam:   Vitals  Blood pressure 134/87, pulse (!) 101, temperature 99.1 F (37.3 C), temperature source Oral, resp. rate 18, SpO2 98 %.   1. General developed female, laying in bed, in mild discomfort  2. Normal affect and insight, Not Suicidal or Homicidal, Awake Alert, Oriented X 3.  3. No F.N deficits, ALL C.Nerves Intact, Strength 5/5 all 4 extremities, Sensation intact all 4 extremities, Plantars down going.  4. Ears and Eyes appear Normal, Conjunctivae clear, PERRLA. Moist Oral Mucosa.  5. Supple Neck, No JVD,   6. Symmetrical Chest wall movement, Good air movement  bilaterally, CTAB.  7. RRR, No Gallops, Rubs or Murmurs, No Parasternal Heave.  8  No Cyanosis, Normal Skin Turgor, No Skin Rash or Bruise.  10. Good muscle tone,  joints appear normal , no effusions, Normal ROM.     Data Review:    CBC Recent Labs  Lab 12/04/22 1011  WBC 4.6  HGB 12.0  HCT 36.9  PLT 155  MCV 91.1  MCH 29.6  MCHC 32.5  RDW 13.2  LYMPHSABS 0.7  MONOABS 0.3  EOSABS 0.1  BASOSABS 0.0   ------------------------------------------------------------------------------------------------------------------  Chemistries  Recent Labs  Lab 12/04/22 1011  NA 136  K 3.1*  CL 108  CO2 18*  GLUCOSE 122*  BUN 13  CREATININE 0.94  CALCIUM 8.4*  AST 36  ALT 44  ALKPHOS 58  BILITOT 0.5   ------------------------------------------------------------------------------------------------------------------ CrCl cannot be calculated (Unknown ideal weight.). ------------------------------------------------------------------------------------------------------------------ No results for input(s): "TSH", "T4TOTAL", "T3FREE", "THYROIDAB" in the last 72 hours.  Invalid input(s):  "FREET3"  Coagulation profile No results for input(s): "INR", "PROTIME" in the last 168 hours. ------------------------------------------------------------------------------------------------------------------- No results for input(s): "DDIMER" in the last 72 hours. -------------------------------------------------------------------------------------------------------------------  Cardiac Enzymes No results for input(s): "CKMB", "TROPONINI", "MYOGLOBIN" in the last 168 hours.  Invalid input(s): "CK" ------------------------------------------------------------------------------------------------------------------ No results found for: "BNP"   ---------------------------------------------------------------------------------------------------------------  Urinalysis    Component Value Date/Time   COLORURINE YELLOW 10/01/2017 0038   APPEARANCEUR CLEAR 10/01/2017 0038   LABSPEC 1.025 10/01/2017 0038   PHURINE 6.5 10/01/2017 0038   GLUCOSEU NEGATIVE 10/01/2017 0038   HGBUR NEGATIVE 10/01/2017 0038   BILIRUBINUR NEGATIVE 10/01/2017 0038   KETONESUR NEGATIVE 10/01/2017 0038   PROTEINUR NEGATIVE 10/01/2017 0038   NITRITE NEGATIVE 10/01/2017 0038   LEUKOCYTESUR NEGATIVE 10/01/2017 0038    ----------------------------------------------------------------------------------------------------------------   Imaging Results:    DG Chest 2 View  Result Date: 12/04/2022 CLINICAL DATA:  Recent diagnosis of influenza infection EXAM: CHEST - 2 VIEW COMPARISON:  Chest radiograph dated 12/03/2015 FINDINGS: Low lung volumes. Patchy right middle lobe opacity. No pleural effusion or pneumothorax. Enlarged cardiomediastinal silhouette is likely projectional related to low lung volumes. The visualized skeletal structures are unremarkable. IMPRESSION: Patchy right middle lobe opacity, likely corresponding to reported influenza infection. Electronically Signed   By: Darrin Nipper M.D.   On: 12/04/2022 10:41      Assessment & Plan:    Principal Problem:   Intractable nausea and vomiting Active Problems:   Influenza A with pneumonia   Influenza A with pneumonia Tractable nausea and vomiting -Patient presents with flu symptoms for last 48 to 72 hours, fever, chills, sore throat, generalized body ache, nausea and vomiting -Chest x-ray significant for up pneumonia, will check procalcitonin, if elevated will start on IV Rocephin and azithromycin for reported post bacterial infection, if within normal limits we will hold on antibiotics. -Will start on Tamiflu. -Continue with IV fluids -Continue with scheduled Tylenol -Continue with scheduled dextromethorphan -Will keep on as needed Compazine -Will have her try regular diet, if cannot tolerate then will downgrade to clear liquid  Hypokalemia -Repleted  DVT Prophylaxis SCDs   AM Labs Ordered, also please review Full Orders  Family Communication: Admission, patients condition and plan of care including tests being ordered have been discussed with the patient  who indicate understanding and agree with the plan and Code Status.  Code Status FULL  Likely DC to   HOME  Condition GUARDED    Consults called: NONE    Admission status: OBSERVATION    Time spent in  minutes : 50 MINUTES   Huey Bienenstock M.D on 12/04/2022 at 3:45 PM   [facility] - Office  [phone]

## 2022-12-05 ENCOUNTER — Other Ambulatory Visit (HOSPITAL_COMMUNITY): Payer: Self-pay

## 2022-12-05 DIAGNOSIS — J09X1 Influenza due to identified novel influenza A virus with pneumonia: Secondary | ICD-10-CM | POA: Diagnosis not present

## 2022-12-05 DIAGNOSIS — R112 Nausea with vomiting, unspecified: Secondary | ICD-10-CM | POA: Diagnosis not present

## 2022-12-05 LAB — CBC
HCT: 33.8 % — ABNORMAL LOW (ref 36.0–46.0)
Hemoglobin: 11.2 g/dL — ABNORMAL LOW (ref 12.0–15.0)
MCH: 29.4 pg (ref 26.0–34.0)
MCHC: 33.1 g/dL (ref 30.0–36.0)
MCV: 88.7 fL (ref 80.0–100.0)
Platelets: 135 10*3/uL — ABNORMAL LOW (ref 150–400)
RBC: 3.81 MIL/uL — ABNORMAL LOW (ref 3.87–5.11)
RDW: 13.2 % (ref 11.5–15.5)
WBC: 2.5 10*3/uL — ABNORMAL LOW (ref 4.0–10.5)
nRBC: 0 % (ref 0.0–0.2)

## 2022-12-05 LAB — BASIC METABOLIC PANEL
Anion gap: 8 (ref 5–15)
BUN: 10 mg/dL (ref 6–20)
CO2: 22 mmol/L (ref 22–32)
Calcium: 8.4 mg/dL — ABNORMAL LOW (ref 8.9–10.3)
Chloride: 108 mmol/L (ref 98–111)
Creatinine, Ser: 0.81 mg/dL (ref 0.44–1.00)
GFR, Estimated: >60 mL/min (ref >60)
Glucose, Bld: 79 mg/dL (ref 70–99)
Potassium: 3.7 mmol/L (ref 3.5–5.1)
Sodium: 138 mmol/L (ref 135–145)

## 2022-12-05 LAB — HIV ANTIBODY (ROUTINE TESTING W REFLEX): HIV Screen 4th Generation wRfx: NONREACTIVE

## 2022-12-05 LAB — PHOSPHORUS: Phosphorus: 3 mg/dL (ref 2.5–4.6)

## 2022-12-05 MED ORDER — OSELTAMIVIR PHOSPHATE 75 MG PO CAPS
75.0000 mg | ORAL_CAPSULE | Freq: Two times a day (BID) | ORAL | 0 refills | Status: AC
  Filled 2022-12-05: qty 8, 4d supply, fill #0

## 2022-12-05 MED ORDER — LACTATED RINGERS IV SOLN: INTRAVENOUS | Status: DC

## 2022-12-05 MED ORDER — GUAIFENESIN ER 600 MG PO TB12
1200.0000 mg | ORAL_TABLET | Freq: Two times a day (BID) | ORAL | Status: DC
  Administered 2022-12-05: 1200 mg via ORAL
  Filled 2022-12-05: qty 2

## 2022-12-05 NOTE — Discharge Instructions (Signed)
Follow with Primary MD in 7 days   Get CBC, CMP,  checked  by Primary MD next visit.    Disposition Home    Diet: Regular Diet   On your next visit with your primary care physician please Get Medicines reviewed and adjusted.   Please request your Prim.MD to go over all Hospital Tests and Procedure/Radiological results at the follow up, please get all Hospital records sent to your Prim MD by signing hospital release before you go home.   If you experience worsening of your admission symptoms, develop shortness of breath, life threatening emergency, suicidal or homicidal thoughts you must seek medical attention immediately by calling 911 or calling your MD immediately  if symptoms less severe.  You Must read complete instructions/literature along with all the possible adverse reactions/side effects for all the Medicines you take and that have been prescribed to you. Take any new Medicines after you have completely understood and accpet all the possible adverse reactions/side effects.   Do not drive, operating heavy machinery, perform activities at heights, swimming or participation in water activities or provide baby sitting services if your were admitted for syncope or siezures until you have seen by Primary MD or a Neurologist and advised to do so again.  Do not drive when taking Pain medications.    Do not take more than prescribed Pain, Sleep and Anxiety Medications  Special Instructions: If you have smoked or chewed Tobacco  in the last 2 yrs please stop smoking, stop any regular Alcohol  and or any Recreational drug use.  Wear Seat belts while driving.   Please note  You were cared for by a hospitalist during your hospital stay. If you have any questions about your discharge medications or the care you received while you were in the hospital after you are discharged, you can call the unit and asked to speak with the hospitalist on call if the hospitalist that took care of you  is not available. Once you are discharged, your primary care physician will handle any further medical issues. Please note that NO REFILLS for any discharge medications will be authorized once you are discharged, as it is imperative that you return to your primary care physician (or establish a relationship with a primary care physician if you do not have one) for your aftercare needs so that they can reassess your need for medications and monitor your lab values.

## 2022-12-05 NOTE — Progress Notes (Signed)
RN spoke with Dr. Waldron Labs in regards to discharge. Per Dr. Waldron Labs, patient can discharge around 5pm today after IVF are complete

## 2022-12-05 NOTE — Discharge Summary (Signed)
Physician Discharge Summary  Rachel Greene FAO:[id number] DOB: 10-Jul-1991 DOA: 12/04/2022  PCP: Merryl Hacker, No  Admit date: 12/04/2022 Discharge date: 12/05/2022  Admitted From: (Home) Disposition:  (Home)  Recommendations for Outpatient Follow-up:  Follow up with PCP in 1-2 weeks Please obtain BMP/CBC in one week Please follow up on the following pending results:   Discharge Condition: (Stable) Diet recommendation: Regular   Brief/Interim Summary:  Rachel Greene  is a 32 y.o. female, without significant past medical history, she presents to ED secondary to complaints of shortness of breath, nausea and vomiting, patient was recently diagnosed with influenza A , she reports significant nausea, unable to tolerate oral intake while in ED, so she was admitted for further management    Influenza A with pneumonia Tractable nausea and vomiting -Patient presents with flu symptoms for last 48 to 72 hours, fever, chills, sore throat, generalized body ache, nausea and vomiting, Chest x-ray significant for  pneumonia, viral pneumonia related to influenza, not related to bacterial pneumonia especially with normal procalcitonin, so she was treated only with Tamiflu, no antibiotics, she was kept on iv  fluids, scheduled Tylenol, cough medicine, and as needed Compazine , morning she is feeling much better, she did not have any hypoxia, she will be discharged today on Tamiflu .  Hypokalemia -Repleted    Discharge Diagnoses:  Principal Problem:   Intractable nausea and vomiting Active Problems:   Influenza A with pneumonia    Discharge Instructions  Discharge Instructions     Diet - low sodium heart healthy   Complete by: As directed    Discharge instructions   Complete by: As directed    Follow with Primary MD in 7 days   Get CBC, CMP,  checked  by Primary MD next visit.    Disposition Home    Diet: Regular Diet   On your next visit with your primary care physician please Get  Medicines reviewed and adjusted.   Please request your Prim.MD to go over all Hospital Tests and Procedure/Radiological results at the follow up, please get all Hospital records sent to your Prim MD by signing hospital release before you go home.   If you experience worsening of your admission symptoms, develop shortness of breath, life threatening emergency, suicidal or homicidal thoughts you must seek medical attention immediately by calling 911 or calling your MD immediately  if symptoms less severe.  You Must read complete instructions/literature along with all the possible adverse reactions/side effects for all the Medicines you take and that have been prescribed to you. Take any new Medicines after you have completely understood and accpet all the possible adverse reactions/side effects.   Do not drive, operating heavy machinery, perform activities at heights, swimming or participation in water activities or provide baby sitting services if your were admitted for syncope or siezures until you have seen by Primary MD or a Neurologist and advised to do so again.  Do not drive when taking Pain medications.    Do not take more than prescribed Pain, Sleep and Anxiety Medications  Special Instructions: If you have smoked or chewed Tobacco  in the last 2 yrs please stop smoking, stop any regular Alcohol  and or any Recreational drug use.  Wear Seat belts while driving.   Please note  You were cared for by a hospitalist during your hospital stay. If you have any questions about your discharge medications or the care you received while you were in the hospital after you are discharged, you  can call the unit and asked to speak with the hospitalist on call if the hospitalist that took care of you is not available. Once you are discharged, your primary care physician will handle any further medical issues. Please note that NO REFILLS for any discharge medications will be authorized once you are  discharged, as it is imperative that you return to your primary care physician (or establish a relationship with a primary care physician if you do not have one) for your aftercare needs so that they can reassess your need for medications and monitor your lab values.   Increase activity slowly   Complete by: As directed       Allergies as of 12/05/2022       Reactions   Penicillins Hives        Medication List     STOP taking these medications    ondansetron 4 MG disintegrating tablet Commonly known as: ZOFRAN-ODT       TAKE these medications    oseltamivir 75 MG capsule Commonly known as: TAMIFLU Take 1 capsule (75 mg total) by mouth 2 (two) times daily.        Allergies  Allergen Reactions   Penicillins Hives    Consultations: none   Procedures/Studies: DG Chest 2 View  Result Date: 12/04/2022 CLINICAL DATA:  Recent diagnosis of influenza infection EXAM: CHEST - 2 VIEW COMPARISON:  Chest radiograph dated 12/03/2015 FINDINGS: Low lung volumes. Patchy right middle lobe opacity. No pleural effusion or pneumothorax. Enlarged cardiomediastinal silhouette is likely projectional related to low lung volumes. The visualized skeletal structures are unremarkable. IMPRESSION: Patchy right middle lobe opacity, likely corresponding to reported influenza infection. Electronically Signed   By: Agustin Cree M.D.   On: 12/04/2022 10:41     Subjective:  Nausea much improved , she had good fluid intake.  Discharge Exam: Vitals:   12/04/22 1928 12/05/22 0405  BP: 120/80 128/84  Pulse: 76 82  Resp: 17   Temp: 98.2 F (36.8 C) 98.6 F (37 C)  SpO2: 100% 96%   Vitals:   12/04/22 1606 12/04/22 1629 12/04/22 1928 12/05/22 0405  BP: 123/75  120/80 128/84  Pulse: 80  76 82  Resp: 18  17   Temp: (!) 97.5 F (36.4 C)  98.2 F (36.8 C) 98.6 F (37 C)  TempSrc: Oral  Oral Oral  SpO2: 99%  100% 96%  Weight:  101.1 kg    Height: 5\' 7"  (1.702 m)       General: Pt is alert,  awake, not in acute distress Respiratory: CTA bilaterally, no wheezing, no rhonchi Abdominal: Soft, NT, ND, bowel sounds + Extremities: no edema, no cyanosis    The results of significant diagnostics from this hospitalization (including imaging, microbiology, ancillary and laboratory) are listed below for reference.     Microbiology: Recent Results (from the past 240 hour(s))  Resp panel by RT-PCR (RSV, Flu A&B, Covid) Anterior Nasal Swab     Status: Abnormal   Collection Time: 12/03/22 10:00 AM   Specimen: Anterior Nasal Swab  Result Value Ref Range Status   SARS Coronavirus 2 by RT PCR NEGATIVE NEGATIVE Final    Comment: (NOTE) SARS-CoV-2 target nucleic acids are NOT DETECTED.  The SARS-CoV-2 RNA is generally detectable in upper respiratory specimens during the acute phase of infection. The lowest concentration of SARS-CoV-2 viral copies this assay can detect is 138 copies/mL. A negative result does not preclude SARS-Cov-2 infection and should not be used as the sole basis  for treatment or other patient management decisions. A negative result may occur with  improper specimen collection/handling, submission of specimen other than nasopharyngeal swab, presence of viral mutation(s) within the areas targeted by this assay, and inadequate number of viral copies(<138 copies/mL). A negative result must be combined with clinical observations, patient history, and epidemiological information. The expected result is Negative.  Fact Sheet for Patients:  BloggerCourse.com  Fact Sheet for Healthcare Providers:  SeriousBroker.it  This test is no t yet approved or cleared by the [place] FDA and  has been authorized for detection and/or diagnosis of SARS-CoV-2 by FDA under an Emergency Use Authorization (EUA). This EUA will remain  in effect (meaning this test can be used) for the duration of the COVID-19 declaration under Section  564(b)(1) of the Act, 21 U.S.C.section 360bbb-3(b)(1), unless the authorization is terminated  or revoked sooner.       Influenza A by PCR POSITIVE (A) NEGATIVE Final   Influenza B by PCR NEGATIVE NEGATIVE Final    Comment: (NOTE) The Xpert Xpress SARS-CoV-2/FLU/RSV plus assay is intended as an aid in the diagnosis of influenza from Nasopharyngeal swab specimens and should not be used as a sole basis for treatment. Nasal washings and aspirates are unacceptable for Xpert Xpress SARS-CoV-2/FLU/RSV testing.  Fact Sheet for Patients: BloggerCourse.com  Fact Sheet for Healthcare Providers: SeriousBroker.it  This test is not yet approved or cleared by the [place] FDA and has been authorized for detection and/or diagnosis of SARS-CoV-2 by FDA under an Emergency Use Authorization (EUA). This EUA will remain in effect (meaning this test can be used) for the duration of the COVID-19 declaration under Section 564(b)(1) of the Act, 21 U.S.C. section 360bbb-3(b)(1), unless the authorization is terminated or revoked.     Resp Syncytial Virus by PCR NEGATIVE NEGATIVE Final    Comment: (NOTE) Fact Sheet for Patients: BloggerCourse.com  Fact Sheet for Healthcare Providers: SeriousBroker.it  This test is not yet approved or cleared by the [place] FDA and has been authorized for detection and/or diagnosis of SARS-CoV-2 by FDA under an Emergency Use Authorization (EUA). This EUA will remain in effect (meaning this test can be used) for the duration of the COVID-19 declaration under Section 564(b)(1) of the Act, 21 U.S.C. section 360bbb-3(b)(1), unless the authorization is terminated or revoked.  Performed at [facility], [address] [place], [place] [address]   Group A Strep by PCR     Status: None   Collection Time: 12/03/22 10:00 AM   Specimen:  Anterior Nasal Swab; Sterile Swab  Result Value Ref Range Status   Group A Strep by PCR NOT DETECTED NOT DETECTED Final    Comment: Performed at [facility], [address] [place], [place] [address]     Labs: BNP (last 3 results) No results for input(s): "BNP" in the last 8760 hours. Basic Metabolic Panel: Recent Labs  Lab 12/04/22 1011 12/05/22 0736  NA 136 138  K 3.1* 3.7  CL 108 108  CO2 18* 22  GLUCOSE 122* 79  BUN 13 10  CREATININE 0.94 0.81  CALCIUM 8.4* 8.4*  PHOS  --  3.0   Liver Function Tests: Recent Labs  Lab 12/04/22 1011  AST 36  ALT 44  ALKPHOS 58  BILITOT 0.5  PROT 6.6  ALBUMIN 3.4*   No results for input(s): "LIPASE", "AMYLASE" in the last 168 hours. No results for input(s): "AMMONIA" in the last 168 hours. CBC: Recent Labs  Lab 12/04/22 1011  12/05/22 0736  WBC 4.6 2.5*  NEUTROABS 3.5  --   HGB 12.0 11.2*  HCT 36.9 33.8*  MCV 91.1 88.7  PLT 155 135*   Cardiac Enzymes: No results for input(s): "CKTOTAL", "CKMB", "CKMBINDEX", "TROPONINI" in the last 168 hours. BNP: Invalid input(s): "POCBNP" CBG: No results for input(s): "GLUCAP" in the last 168 hours. D-Dimer No results for input(s): "DDIMER" in the last 72 hours. Hgb A1c No results for input(s): "HGBA1C" in the last 72 hours. Lipid Profile No results for input(s): "CHOL", "HDL", "LDLCALC", "TRIG", "CHOLHDL", "LDLDIRECT" in the last 72 hours. Thyroid function studies No results for input(s): "TSH", "T4TOTAL", "T3FREE", "THYROIDAB" in the last 72 hours.  Invalid input(s): "FREET3" Anemia work up No results for input(s): "VITAMINB12", "FOLATE", "FERRITIN", "TIBC", "IRON", "RETICCTPCT" in the last 72 hours. Urinalysis    Component Value Date/Time   COLORURINE YELLOW 10/01/2017 0038   APPEARANCEUR CLEAR 10/01/2017 0038   LABSPEC 1.025 10/01/2017 0038   PHURINE 6.5 10/01/2017 0038   GLUCOSEU NEGATIVE 10/01/2017 0038   HGBUR NEGATIVE 10/01/2017 0038    BILIRUBINUR NEGATIVE 10/01/2017 0038   KETONESUR NEGATIVE 10/01/2017 0038   PROTEINUR NEGATIVE 10/01/2017 0038   NITRITE NEGATIVE 10/01/2017 0038   LEUKOCYTESUR NEGATIVE 10/01/2017 0038   Sepsis Labs Recent Labs  Lab 12/04/22 1011 12/05/22 0736  WBC 4.6 2.5*   Microbiology Recent Results (from the past 240 hour(s))  Resp panel by RT-PCR (RSV, Flu A&B, Covid) Anterior Nasal Swab     Status: Abnormal   Collection Time: 12/03/22 10:00 AM   Specimen: Anterior Nasal Swab  Result Value Ref Range Status   SARS Coronavirus 2 by RT PCR NEGATIVE NEGATIVE Final    Comment: (NOTE) SARS-CoV-2 target nucleic acids are NOT DETECTED.  The SARS-CoV-2 RNA is generally detectable in upper respiratory specimens during the acute phase of infection. The lowest concentration of SARS-CoV-2 viral copies this assay can detect is 138 copies/mL. A negative result does not preclude SARS-Cov-2 infection and should not be used as the sole basis for treatment or other patient management decisions. A negative result may occur with  improper specimen collection/handling, submission of specimen other than nasopharyngeal swab, presence of viral mutation(s) within the areas targeted by this assay, and inadequate number of viral copies(<138 copies/mL). A negative result must be combined with clinical observations, patient history, and epidemiological information. The expected result is Negative.  Fact Sheet for Patients:  BloggerCourse.com  Fact Sheet for Healthcare Providers:  SeriousBroker.it  This test is no t yet approved or cleared by the [place] FDA and  has been authorized for detection and/or diagnosis of SARS-CoV-2 by FDA under an Emergency Use Authorization (EUA). This EUA will remain  in effect (meaning this test can be used) for the duration of the COVID-19 declaration under Section 564(b)(1) of the Act, 21 U.S.C.section 360bbb-3(b)(1),  unless the authorization is terminated  or revoked sooner.       Influenza A by PCR POSITIVE (A) NEGATIVE Final   Influenza B by PCR NEGATIVE NEGATIVE Final    Comment: (NOTE) The Xpert Xpress SARS-CoV-2/FLU/RSV plus assay is intended as an aid in the diagnosis of influenza from Nasopharyngeal swab specimens and should not be used as a sole basis for treatment. Nasal washings and aspirates are unacceptable for Xpert Xpress SARS-CoV-2/FLU/RSV testing.  Fact Sheet for Patients: BloggerCourse.com  Fact Sheet for Healthcare Providers: SeriousBroker.it  This test is not yet approved or cleared by the [place] FDA and has been authorized for detection and/or diagnosis  of SARS-CoV-2 by FDA under an Emergency Use Authorization (EUA). This EUA will remain in effect (meaning this test can be used) for the duration of the COVID-19 declaration under Section 564(b)(1) of the Act, 21 U.S.C. section 360bbb-3(b)(1), unless the authorization is terminated or revoked.     Resp Syncytial Virus by PCR NEGATIVE NEGATIVE Final    Comment: (NOTE) Fact Sheet for Patients: EntrepreneurPulse.com.au  Fact Sheet for Healthcare Providers: IncredibleEmployment.be  This test is not yet approved or cleared by the [place] FDA and has been authorized for detection and/or diagnosis of SARS-CoV-2 by FDA under an Emergency Use Authorization (EUA). This EUA will remain in effect (meaning this test can be used) for the duration of the COVID-19 declaration under Section 564(b)(1) of the Act, 21 U.S.C. section 360bbb-3(b)(1), unless the authorization is terminated or revoked.  Performed at [facility], [address] [place], [place] [address]   Group A Strep by PCR     Status: None   Collection Time: 12/03/22 10:00 AM   Specimen: Anterior Nasal Swab; Sterile Swab  Result Value Ref Range  Status   Group A Strep by PCR NOT DETECTED NOT DETECTED Final    Comment: Performed at [facility], [address] [place], [place] [address]     Time coordinating discharge: 20 minutes  SIGNED:   Phillips Climes, MD  [facility] 12/05/2022, 3:03 PM Pager   If 7PM-7AM, please contact night-coverage [email or web address]
